# Patient Record
Sex: Female | Born: 1985
Health system: Southern US, Community
[De-identification: ages and names within clinical notes are randomized; demographics above are authoritative.]

## PROBLEM LIST (undated history)

## (undated) DIAGNOSIS — O99119 Other diseases of the blood and blood-forming organs and certain disorders involving the immune mechanism complicating pregnancy, unspecified trimester: Secondary | ICD-10-CM

## (undated) DIAGNOSIS — R87629 Unspecified abnormal cytological findings in specimens from vagina: Secondary | ICD-10-CM

## (undated) DIAGNOSIS — Z8619 Personal history of other infectious and parasitic diseases: Secondary | ICD-10-CM

## (undated) DIAGNOSIS — IMO0001 Reserved for inherently not codable concepts without codable children: Secondary | ICD-10-CM

## (undated) DIAGNOSIS — D6959 Other secondary thrombocytopenia: Secondary | ICD-10-CM

## (undated) DIAGNOSIS — D696 Thrombocytopenia, unspecified: Secondary | ICD-10-CM

## (undated) DIAGNOSIS — G44009 Cluster headache syndrome, unspecified, not intractable: Secondary | ICD-10-CM

## (undated) DIAGNOSIS — K219 Gastro-esophageal reflux disease without esophagitis: Secondary | ICD-10-CM

## (undated) DIAGNOSIS — F419 Anxiety disorder, unspecified: Secondary | ICD-10-CM

## (undated) HISTORY — DX: Other secondary thrombocytopenia: D69.59

## (undated) HISTORY — DX: Cluster headache syndrome, unspecified, not intractable: G44.009

## (undated) HISTORY — DX: Unspecified abnormal cytological findings in specimens from vagina: R87.629

## (undated) HISTORY — DX: Anxiety disorder, unspecified: F41.9

## (undated) HISTORY — DX: Personal history of other infectious and parasitic diseases: Z86.19

## (undated) HISTORY — PX: UPPER GASTROINTESTINAL ENDOSCOPY: SHX188

## (undated) HISTORY — DX: Gastro-esophageal reflux disease without esophagitis: K21.9

---
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
## 2007-02-08 DIAGNOSIS — D693 Immune thrombocytopenic purpura: Secondary | ICD-10-CM

## 2007-02-08 DIAGNOSIS — D6959 Other secondary thrombocytopenia: Secondary | ICD-10-CM

## 2007-02-08 HISTORY — DX: Immune thrombocytopenic purpura: D69.3

## 2007-02-08 HISTORY — DX: Other secondary thrombocytopenia: D69.59

## 2009-03-10 HISTORY — PX: MASTOPEXY: SUR857

## 2009-03-10 HISTORY — PX: OTHER SURGICAL HISTORY: SHX169

## 2011-11-12 ENCOUNTER — Ambulatory Visit (INDEPENDENT_AMBULATORY_CARE_PROVIDER_SITE_OTHER): Payer: 59 | Admitting: Internal Medicine

## 2011-11-12 ENCOUNTER — Encounter: Payer: Self-pay | Admitting: Internal Medicine

## 2011-11-12 ENCOUNTER — Other Ambulatory Visit (INDEPENDENT_AMBULATORY_CARE_PROVIDER_SITE_OTHER): Payer: 59

## 2011-11-12 VITALS — BP 126/72 | HR 72 | Temp 97.2°F | Ht 64.0 in | Wt 132.8 lb

## 2011-11-12 DIAGNOSIS — Z124 Encounter for screening for malignant neoplasm of cervix: Secondary | ICD-10-CM

## 2011-11-12 DIAGNOSIS — D239 Other benign neoplasm of skin, unspecified: Secondary | ICD-10-CM

## 2011-11-12 DIAGNOSIS — F411 Generalized anxiety disorder: Secondary | ICD-10-CM

## 2011-11-12 DIAGNOSIS — D229 Melanocytic nevi, unspecified: Secondary | ICD-10-CM

## 2011-11-12 DIAGNOSIS — F419 Anxiety disorder, unspecified: Secondary | ICD-10-CM | POA: Insufficient documentation

## 2011-11-12 DIAGNOSIS — Z0000 Encounter for general adult medical examination without abnormal findings: Secondary | ICD-10-CM

## 2011-11-12 LAB — LIPID PANEL
Cholesterol: 177 mg/dL (ref 0–200)
VLDL: 11 mg/dL (ref 0.0–40.0)

## 2011-11-12 LAB — URINALYSIS, ROUTINE W REFLEX MICROSCOPIC
Leukocytes, UA: NEGATIVE
Nitrite: NEGATIVE
Total Protein, Urine: NEGATIVE
pH: 6.5 (ref 5.0–8.0)

## 2011-11-12 LAB — HEPATIC FUNCTION PANEL
ALT: 16 U/L (ref 0–35)
AST: 21 U/L (ref 0–37)
Alkaline Phosphatase: 39 U/L (ref 39–117)
Bilirubin, Direct: 0.1 mg/dL (ref 0.0–0.3)
Total Protein: 7.1 g/dL (ref 6.0–8.3)

## 2011-11-12 LAB — TSH: TSH: 0.85 u[IU]/mL (ref 0.35–5.50)

## 2011-11-12 LAB — CBC WITH DIFFERENTIAL/PLATELET
Basophils Absolute: 0.1 10*3/uL (ref 0.0–0.1)
Eosinophils Relative: 1.3 % (ref 0.0–5.0)
Lymphocytes Relative: 37.6 % (ref 12.0–46.0)
Monocytes Relative: 5.9 % (ref 3.0–12.0)
Neutrophils Relative %: 54.1 % (ref 43.0–77.0)
Platelets: 161 10*3/uL (ref 150.0–400.0)
RDW: 13.6 % (ref 11.5–14.6)
WBC: 5.3 10*3/uL (ref 4.5–10.5)

## 2011-11-12 LAB — BASIC METABOLIC PANEL
BUN: 11 mg/dL (ref 6–23)
Calcium: 9.4 mg/dL (ref 8.4–10.5)
Creatinine, Ser: 0.6 mg/dL (ref 0.4–1.2)
GFR: 124.32 mL/min (ref >60.00)
Potassium: 4.6 mEq/L (ref 3.5–5.1)

## 2011-11-12 MED ORDER — ONDANSETRON 4 MG PO TBDP: 4.0000 mg | ORAL_TABLET | Freq: Three times a day (TID) | ORAL | Status: DC | PRN

## 2011-11-12 MED ORDER — NONFORMULARY OR COMPOUNDED ITEM: Status: DC

## 2011-11-12 MED ORDER — BUPROPION HCL ER (XL) 150 MG PO TB24: 150.0000 mg | ORAL_TABLET | Freq: Every day | ORAL | Status: DC

## 2011-11-12 NOTE — Progress Notes (Signed)
Subjective:    Patient ID: Lynn West, female    DOB: 02-04-1986, 26 y.o.   MRN: [id number]  HPI  New patient to me, here to establish care Also patient is here today for annual physical. Patient feels well overall.  Concerned about change in mole, hx dysplastic nevi - regular tanning bed use - ?local derm eval Also requests med refills  Past Medical History  Diagnosis Date  . History of chicken pox     childhood  . Headaches, cluster     stress related  . GERD (gastroesophageal reflux disease)   . 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 Anxiety    Family History  Problem Relation Age of Onset  . Arthritis Mother   . Hypertension Mother   . Arthritis Father   . Hyperlipidemia Father   . Hypertension Father   . Colon cancer Maternal Grandfather   . Breast cancer Paternal Grandmother   . Arthritis Other   . Hyperlipidemia Other   . Diabetes Other    History  Substance Use Topics  . Smoking status: Never Smoker   . Smokeless tobacco: Not on file  . Alcohol Use: Yes     social  moved to [place] 10/2011 to join [facility] [place] - previously from [place]  Review of Systems Constitutional: Negative for fever or weight change.  Respiratory: Negative for cough and shortness of breath.   Cardiovascular: Negative for chest pain or palpitations.  Gastrointestinal: Negative for abdominal pain, no bowel changes.  Musculoskeletal: Negative for gait problem or joint swelling.  Skin: Negative for rash.  Neurological: Negative for dizziness or headache.  Derm: change in skin mole? No other specific complaints in a complete review of systems (except as listed in HPI above).     Objective:   Physical Exam BP 126/72  Pulse 72  Temp 97.2 F (36.2 C) (Oral)  Ht 5\' 4"  (1.626 m)  Wt 132 lb 12.8 oz (60.238 kg)  BMI 22.80 kg/m2  SpO2 97% Wt Readings from Last 3 Encounters:  11/12/11 132 lb 12.8 oz (60.238 kg)   Constitutional: She appears well-developed and  well-nourished. No distress.  HENT: Head: Normocephalic and atraumatic. Ears: B TMs ok, no erythema or effusion; Nose: Nose normal. Mouth/Throat: Oropharynx is clear and moist. No oropharyngeal exudate.  Eyes: Conjunctivae and EOM are normal. Pupils are equal, round, and reactive to light. No scleral icterus.  Neck: Normal range of motion. Neck supple. No JVD present. No thyromegaly present.  Cardiovascular: Normal rate, regular rhythm and normal heart sounds.  No murmur heard. No BLE edema. Pulmonary/Chest: Effort normal and breath sounds normal. No respiratory distress. She has no wheezes.  Abdominal: Soft. Bowel sounds are normal. She exhibits no distension. There is no tenderness. no masses Musculoskeletal: Normal range of motion, no joint effusions. No gross deformities Neurological: She is alert and oriented to person, place, and time. No cranial nerve deficit. Coordination normal.  Skin: Skin is warm and dry. No rash noted. No erythema. numerous typical appearing moles - BUE and trunk Psychiatric: She has a normal mood and affect. Her behavior is normal. Judgment and thought content normal.   No results found for this basename: WBC, HGB, HCT, PLT, GLUCOSE, CHOL, TRIG, HDL, LDLDIRECT, LDLCALC, ALT, AST, NA, K, CL, CREATININE, BUN, CO2, TSH, PSA, INR, GLUF, HGBA1C, MICROALBUR       Assessment & Plan:   CPX/v70.0 - Patient has been counseled on age-appropriate routine health concerns for screening  and prevention. These are reviewed and up-to-date. Immunizations are up-to-date or declined. Labs and  Reviewed. Refer to gyn  Change in mole - regular tanning bed use - refer to derm for eval/tx  Also see problem list. Medications and labs reviewed today.

## 2011-11-12 NOTE — Assessment & Plan Note (Signed)
On wellbutrin for mgmt of dyspnea symptoms since 2010 The current medical regimen is effective;  continue present plan and medications.

## 2011-11-12 NOTE — Patient Instructions (Signed)
It was good to see you today. We have reviewed your prior records including labs and tests today Health Maintenance reviewed - all recommended immunizations and age-appropriate screenings are up-to-date. Medications reviewed, no changes at this time. Refill on medication(s) as discussed today. Test(s) ordered today. Your results will be called to you after review (48-72hours after test completion). If any changes need to be made, you will be notified at that time. we'll make referral to gynecology and dermatology . Our office will contact you regarding appointment(s) once made. Please schedule followup in 1 year for review, call sooner if problems.

## 2011-11-13 ENCOUNTER — Telehealth: Payer: Self-pay | Admitting: *Deleted

## 2011-11-13 DIAGNOSIS — Z0000 Encounter for general adult medical examination without abnormal findings: Secondary | ICD-10-CM

## 2011-11-13 NOTE — Telephone Encounter (Signed)
Received staff msg pt made cpx for 11/2012 putting labs in epic...Raechel Chute

## 2011-11-13 NOTE — Telephone Encounter (Signed)
Message copied by Deatra James on Thu Nov 13, 2011 12:15 PM ------      Message from: Etheleen Sia      Created: Wed Nov 12, 2011  4:33 PM      Regarding: LABS       PHYSICAL LABS FOR SEPT 2014

## 2012-06-03 ENCOUNTER — Other Ambulatory Visit: Payer: Self-pay | Admitting: *Deleted

## 2012-06-03 MED ORDER — RANITIDINE HCL 150 MG PO TABS: 150.0000 mg | ORAL_TABLET | Freq: Two times a day (BID) | ORAL | Status: DC

## 2012-07-02 ENCOUNTER — Other Ambulatory Visit: Payer: Self-pay | Admitting: Internal Medicine

## 2012-07-02 ENCOUNTER — Telehealth: Payer: Self-pay | Admitting: *Deleted

## 2012-07-02 DIAGNOSIS — G4725 Circadian rhythm sleep disorder, jet lag type: Secondary | ICD-10-CM

## 2012-07-02 MED ORDER — TEMAZEPAM 15 MG PO CAPS: 15.0000 mg | ORAL_CAPSULE | Freq: Every evening | ORAL | Status: DC | PRN

## 2012-07-02 NOTE — Telephone Encounter (Signed)
Left msg on triage stating she is going on vacation to [place]. Wanting to see if md would rx restoril to help her sleep while she is on the plane.MD is out of office. Pls advise...lmb

## 2012-07-02 NOTE — Telephone Encounter (Signed)
Called pt no answer LMOM rx fax to [place]...lmb

## 2012-07-02 NOTE — Telephone Encounter (Signed)
RX called into pharmacy

## 2012-09-16 ENCOUNTER — Telehealth: Payer: Self-pay | Admitting: Internal Medicine

## 2012-09-16 MED ORDER — FLUOCINOLONE ACETONIDE 0.01 % OT OIL: 5.0000 [drp] | TOPICAL_OIL | Freq: Two times a day (BID) | OTIC | Status: DC

## 2012-09-16 NOTE — Telephone Encounter (Signed)
Seen informally on a walk in basis for itching in the left ear w/o pain, drainage or hearing change. There was mild erythema but no abrasion or sign of fungal infection. TM was normal.   Plan - flucocinolone otic 5 qtts bid x 7 days.

## 2012-11-09 ENCOUNTER — Other Ambulatory Visit: Payer: Self-pay | Admitting: Internal Medicine

## 2012-11-12 ENCOUNTER — Encounter: Payer: Self-pay | Admitting: Internal Medicine

## 2012-11-12 ENCOUNTER — Other Ambulatory Visit (INDEPENDENT_AMBULATORY_CARE_PROVIDER_SITE_OTHER): Payer: 59

## 2012-11-12 ENCOUNTER — Ambulatory Visit (INDEPENDENT_AMBULATORY_CARE_PROVIDER_SITE_OTHER): Payer: 59 | Admitting: Internal Medicine

## 2012-11-12 VITALS — BP 122/76 | HR 66 | Temp 97.8°F | Ht 64.0 in | Wt 136.8 lb

## 2012-11-12 DIAGNOSIS — F419 Anxiety disorder, unspecified: Secondary | ICD-10-CM

## 2012-11-12 DIAGNOSIS — Z0000 Encounter for general adult medical examination without abnormal findings: Secondary | ICD-10-CM

## 2012-11-12 DIAGNOSIS — F411 Generalized anxiety disorder: Secondary | ICD-10-CM

## 2012-11-12 LAB — CBC WITH DIFFERENTIAL/PLATELET
Basophils Absolute: 0 10*3/uL (ref 0.0–0.1)
Basophils Relative: 0.4 % (ref 0.0–3.0)
Eosinophils Absolute: 0.2 10*3/uL (ref 0.0–0.7)
Lymphocytes Relative: 34.1 % (ref 12.0–46.0)
MCHC: 34.1 g/dL (ref 30.0–36.0)
Monocytes Relative: 6.3 % (ref 3.0–12.0)
Neutrophils Relative %: 54.9 % (ref 43.0–77.0)
RBC: 4.42 Mil/uL (ref 3.87–5.11)
WBC: 5.1 10*3/uL (ref 4.5–10.5)

## 2012-11-12 LAB — URINALYSIS, ROUTINE W REFLEX MICROSCOPIC
Bilirubin Urine: NEGATIVE
Ketones, ur: NEGATIVE
Leukocytes, UA: NEGATIVE
Urobilinogen, UA: 0.2 (ref 0.0–1.0)

## 2012-11-12 LAB — LIPID PANEL
HDL: 69.2 mg/dL (ref >39.00)
LDL Cholesterol: 100 mg/dL — ABNORMAL HIGH (ref 0–99)
Total CHOL/HDL Ratio: 3
Triglycerides: 44 mg/dL (ref 0.0–149.0)
VLDL: 8.8 mg/dL (ref 0.0–40.0)

## 2012-11-12 LAB — HEPATIC FUNCTION PANEL
ALT: 15 U/L (ref 0–35)
AST: 20 U/L (ref 0–37)
Bilirubin, Direct: 0 mg/dL (ref 0.0–0.3)
Total Bilirubin: 0.5 mg/dL (ref 0.3–1.2)

## 2012-11-12 LAB — BASIC METABOLIC PANEL
CO2: 25 mEq/L (ref 19–32)
Calcium: 9.4 mg/dL (ref 8.4–10.5)
Creatinine, Ser: 0.7 mg/dL (ref 0.4–1.2)
GFR: 112.71 mL/min (ref >60.00)

## 2012-11-12 NOTE — Progress Notes (Signed)
Subjective:    Patient ID: Lynn West, female    DOB: 10-09-1985, 27 y.o.   MRN: [id number]  HPI  patient is here today for annual physical. Patient feels well overall.   Past Medical History  Diagnosis Date  . History of chicken pox     childhood  . Headaches, cluster     stress related  . GERD (gastroesophageal reflux disease)   . 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 Anxiety    Family History  Problem Relation Age of Onset  . Arthritis Mother   . Hypertension Mother   . Arthritis Father   . Hyperlipidemia Father   . Hypertension Father   . Colon cancer Maternal Grandfather   . Breast cancer Paternal Grandmother   . Arthritis Other   . Hyperlipidemia Other   . Diabetes Other    History  Substance Use Topics  . Smoking status: Never Smoker   . Smokeless tobacco: Not on file     Comment: [place] with [facility], moved to [place] 10/2011 from [place]  with spouse  . Alcohol Use: Yes     Comment: social  moved to [place] 10/2011 to join [facility] [place] - previously [place]  Review of Systems Constitutional: Negative for fever or weight change.  Respiratory: Negative for cough and shortness of breath.   Cardiovascular: Negative for chest pain or palpitations.  Gastrointestinal: Negative for abdominal pain, no bowel changes.  Musculoskeletal: Negative for gait problem or joint swelling.  Skin: Negative for rash.  Neurological: Negative for dizziness or headache.  No other specific complaints in a complete review of systems (except as listed in HPI above).     Objective:   Physical Exam BP 122/76  Pulse 66  Temp(Src) 97.8 F (36.6 C) (Oral)  Ht 5\' 4"  (1.626 m)  Wt 136 lb 12.8 oz (62.052 kg)  BMI 23.47 kg/m2  SpO2 98% Wt Readings from Last 3 Encounters:  11/12/12 136 lb 12.8 oz (62.052 kg)  11/12/11 132 lb 12.8 oz (60.238 kg)   Constitutional: She appears well-developed and well-nourished. No distress.  HENT: Head: Normocephalic and atraumatic.  Ears: B TMs ok, no erythema or effusion; Nose: Nose normal. Mouth/Throat: Oropharynx is clear and moist. No oropharyngeal exudate.  Eyes: Conjunctivae and EOM are normal. Pupils are equal, round, and reactive to light. No scleral icterus.  Neck: Normal range of motion. Neck supple. No JVD present. No thyromegaly present.  Cardiovascular: Normal rate, regular rhythm and normal heart sounds.  No murmur heard. No BLE edema. Pulmonary/Chest: Effort normal and breath sounds normal. No respiratory distress. She has no wheezes.  Abdominal: Soft. Bowel sounds are normal. She exhibits no distension. There is no tenderness. no masses Musculoskeletal: Normal range of motion, no joint effusions. No gross deformities Neurological: She is alert and oriented to person, place, and time. No cranial nerve deficit. Coordination normal.  Skin: Skin is warm and dry. No rash noted. No erythema. numerous typical appearing moles - BUE and trunk Psychiatric: She has a mildly anxious mood and affect. Her behavior is normal. Judgment and thought content normal.   Lab Results  Component Value Date   WBC 5.3 11/12/2011   HGB 12.6 11/12/2011   HCT 37.9 11/12/2011   PLT 161.0 11/12/2011   GLUCOSE 86 11/12/2011   CHOL 177 11/12/2011   TRIG 55.0 11/12/2011   HDL 71.50 11/12/2011   LDLCALC 95 11/12/2011   ALT 16 11/12/2011   AST 21 11/12/2011  NA 134* 11/12/2011   K 4.6 11/12/2011   CL 103 11/12/2011   CREATININE 0.6 11/12/2011   BUN 11 11/12/2011   CO2 24 11/12/2011   TSH 0.85 11/12/2011       Assessment & Plan:   CPX/v70.0 - Patient has been counseled on age-appropriate routine health concerns for screening and prevention. These are reviewed and up-to-date. Immunizations are up-to-date or declined. Labs ordered and reviewed.   Also see problem list. Medications and labs reviewed today.

## 2012-11-12 NOTE — Patient Instructions (Addendum)
It was good to see you today. We have reviewed your prior records including labs and tests today Health Maintenance reviewed - all recommended immunizations and age-appropriate screenings are up-to-date. Test(s) ordered today. Your results will be released to MyChart (or called to you) after review, usually within 72hours after test completion. If any changes need to be made, you will be notified at that same time. Medications reviewed and updated, no changes recommended at this time. Contact EAP - let me know if i can help with other counseling if needed Please schedule followup in 12 months for annual exam and labs, call sooner if problems.    Health Maintenance, Females A healthy lifestyle and preventative care can promote health and wellness.  Maintain regular health, dental, and eye exams.  Eat a healthy diet. Foods like vegetables, fruits, whole grains, low-fat dairy products, and lean protein foods contain the nutrients you need without too many calories. Decrease your intake of foods high in solid fats, added sugars, and salt. Get information about a proper diet from your caregiver, if necessary.  Regular physical exercise is one of the most important things you can do for your health. Most adults should get at least 150 minutes of moderate-intensity exercise (any activity that increases your heart rate and causes you to sweat) each week. In addition, most adults need muscle-strengthening exercises on 2 or more days a week.   Maintain a healthy weight. The body mass index (BMI) is a screening tool to identify possible weight problems. It provides an estimate of body fat based on height and weight. Your caregiver can help determine your BMI, and can help you achieve or maintain a healthy weight. For adults 20 years and older:  A BMI below 18.5 is considered underweight.  A BMI of 18.5 to 24.9 is normal.  A BMI of 25 to 29.9 is considered overweight.  A BMI of 30 and above is  considered obese.  Maintain normal blood lipids and cholesterol by exercising and minimizing your intake of saturated fat. Eat a balanced diet with plenty of fruits and vegetables. Blood tests for lipids and cholesterol should begin at age 48 and be repeated every 5 years. If your lipid or cholesterol levels are high, you are over 50, or you are a high risk for heart disease, you may need your cholesterol levels checked more frequently.Ongoing high lipid and cholesterol levels should be treated with medicines if diet and exercise are not effective.  If you smoke, find out from your caregiver how to quit. If you do not use tobacco, do not start.  If you are pregnant, do not drink alcohol. If you are breastfeeding, be very cautious about drinking alcohol. If you are not pregnant and choose to drink alcohol, do not exceed 1 drink per day. One drink is considered to be 12 ounces (355 mL) of beer, 5 ounces (148 mL) of wine, or 1.5 ounces (44 mL) of liquor.  Avoid use of street drugs. Do not share needles with anyone. Ask for help if you need support or instructions about stopping the use of drugs.  High blood pressure causes heart disease and increases the risk of stroke. Blood pressure should be checked at least every 1 to 2 years. Ongoing high blood pressure should be treated with medicines, if weight loss and exercise are not effective.  If you are 64 to 29 years old, ask your caregiver if you should take aspirin to prevent strokes.  Diabetes screening involves taking a blood  sample to check your fasting blood sugar level. This should be done once every 3 years, after age 55, if you are within normal weight and without risk factors for diabetes. Testing should be considered at a younger age or be carried out more frequently if you are overweight and have at least 1 risk factor for diabetes.  Breast cancer screening is essential preventative care for women. You should practice "breast self-awareness."  This means understanding the normal appearance and feel of your breasts and may include breast self-examination. Any changes detected, no matter how small, should be reported to a caregiver. Women in their 83s and 30s should have a clinical breast exam (CBE) by a caregiver as part of a regular health exam every 1 to 3 years. After age 37, women should have a CBE every year. Starting at age 30, women should consider having a mammogram (breast X-ray) every year. Women who have a family history of breast cancer should talk to their caregiver about genetic screening. Women at a high risk of breast cancer should talk to their caregiver about having an MRI and a mammogram every year.  The Pap test is a screening test for cervical cancer. Women should have a Pap test starting at age 3. Between ages 30 and 61, Pap tests should be repeated every 2 years. Beginning at age 44, you should have a Pap test every 3 years as long as the past 3 Pap tests have been normal. If you had a hysterectomy for a problem that was not cancer or a condition that could lead to cancer, then you no longer need Pap tests. If you are between ages 79 and 21, and you have had normal Pap tests going back 10 years, you no longer need Pap tests. If you have had past treatment for cervical cancer or a condition that could lead to cancer, you need Pap tests and screening for cancer for at least 20 years after your treatment. If Pap tests have been discontinued, risk factors (such as a new sexual partner) need to be reassessed to determine if screening should be resumed. Some women have medical problems that increase the chance of getting cervical cancer. In these cases, your caregiver may recommend more frequent screening and Pap tests.  The human papillomavirus (HPV) test is an additional test that may be used for cervical cancer screening. The HPV test looks for the virus that can cause the cell changes on the cervix. The cells collected during the  Pap test can be tested for HPV. The HPV test could be used to screen women aged 98 years and older, and should be used in women of any age who have unclear Pap test results. After the age of 74, women should have HPV testing at the same frequency as a Pap test.  Colorectal cancer can be detected and often prevented. Most routine colorectal cancer screening begins at the age of 55 and continues through age 47. However, your caregiver may recommend screening at an earlier age if you have risk factors for colon cancer. On a yearly basis, your caregiver may provide home test kits to check for hidden blood in the stool. Use of a small camera at the end of a tube, to directly examine the colon (sigmoidoscopy or colonoscopy), can detect the earliest forms of colorectal cancer. Talk to your caregiver about this at age 62, when routine screening begins. Direct examination of the colon should be repeated every 5 to 10 years through age 12,  unless early forms of pre-cancerous polyps or small growths are found.  Hepatitis C blood testing is recommended for all people born from [birth year] through 1967 and any individual with known risks for hepatitis C.  Practice safe sex. Use condoms and avoid high-risk sexual practices to reduce the spread of sexually transmitted infections (STIs). Sexually active women aged 9 and younger should be checked for Chlamydia, which is a common sexually transmitted infection. Older women with new or multiple partners should also be tested for Chlamydia. Testing for other STIs is recommended if you are sexually active and at increased risk.  Osteoporosis is a disease in which the bones lose minerals and strength with aging. This can result in serious bone fractures. The risk of osteoporosis can be identified using a bone density scan. Women ages 60 and over and women at risk for fractures or osteoporosis should discuss screening with their caregivers. Ask your caregiver whether you should be  taking a calcium supplement or vitamin D to reduce the rate of osteoporosis.  Menopause can be associated with physical symptoms and risks. Hormone replacement therapy is available to decrease symptoms and risks. You should talk to your caregiver about whether hormone replacement therapy is right for you.  Use sunscreen with a sun protection factor (SPF) of 30 or greater. Apply sunscreen liberally and repeatedly throughout the day. You should seek shade when your shadow is shorter than you. Protect yourself by wearing long sleeves, pants, a wide-brimmed hat, and sunglasses year round, whenever you are outdoors.  Notify your caregiver of new moles or changes in moles, especially if there is a change in shape or color. Also notify your caregiver if a mole is larger than the size of a pencil eraser.  Stay current with your immunizations. Document Released: 09/09/2010 Document Revised: 05/19/2011 Document Reviewed: 09/09/2010 [facility] Patient Information 2014 [place], [place].

## 2012-11-12 NOTE — Assessment & Plan Note (Signed)
On wellbutrin for mgmt of dyspnea symptoms since 2010 Mild increase in symptoms - not requiring medication change for symptoms mgmt at this time Advised counseling - pt will try EAP 1st and contact me if additional aast needed - or if symptoms worse Verified no si/hi

## 2012-11-16 ENCOUNTER — Encounter: Payer: 59 | Admitting: Internal Medicine

## 2012-11-23 ENCOUNTER — Encounter: Payer: 59 | Admitting: Internal Medicine

## 2012-12-08 ENCOUNTER — Other Ambulatory Visit: Payer: Self-pay | Admitting: Internal Medicine

## 2013-01-03 ENCOUNTER — Other Ambulatory Visit: Payer: Self-pay | Admitting: Internal Medicine

## 2013-04-18 ENCOUNTER — Encounter (HOSPITAL_COMMUNITY): Payer: Self-pay | Admitting: Pharmacist

## 2013-04-29 ENCOUNTER — Ambulatory Visit (HOSPITAL_COMMUNITY)
Admission: RE | Admit: 2013-04-29 | Discharge: 2013-04-29 | Disposition: A | Discharge status: Home / Self Care | Payer: 59 | Source: Ambulatory Visit | Attending: Obstetrics and Gynecology | Admitting: Obstetrics and Gynecology

## 2013-04-29 ENCOUNTER — Encounter (HOSPITAL_BASED_OUTPATIENT_CLINIC_OR_DEPARTMENT_OTHER): Admission: RE | Payer: Self-pay | Source: Ambulatory Visit

## 2013-04-29 ENCOUNTER — Encounter (HOSPITAL_COMMUNITY): Payer: Self-pay | Admitting: *Deleted

## 2013-04-29 ENCOUNTER — Ambulatory Visit (HOSPITAL_BASED_OUTPATIENT_CLINIC_OR_DEPARTMENT_OTHER): Admission: RE | Admit: 2013-04-29 | Payer: 59 | Source: Ambulatory Visit | Admitting: Obstetrics and Gynecology

## 2013-04-29 ENCOUNTER — Encounter (HOSPITAL_COMMUNITY): Payer: 59 | Admitting: Anesthesiology

## 2013-04-29 ENCOUNTER — Encounter (HOSPITAL_COMMUNITY)
Admission: RE | Disposition: A | Discharge status: Home / Self Care | Payer: Self-pay | Source: Ambulatory Visit | Attending: Obstetrics and Gynecology

## 2013-04-29 ENCOUNTER — Ambulatory Visit (HOSPITAL_COMMUNITY): Payer: 59 | Admitting: Anesthesiology

## 2013-04-29 DIAGNOSIS — K219 Gastro-esophageal reflux disease without esophagitis: Secondary | ICD-10-CM | POA: Insufficient documentation

## 2013-04-29 DIAGNOSIS — F411 Generalized anxiety disorder: Secondary | ICD-10-CM | POA: Insufficient documentation

## 2013-04-29 DIAGNOSIS — Q5128 Other doubling of uterus, other specified: Secondary | ICD-10-CM | POA: Insufficient documentation

## 2013-04-29 DIAGNOSIS — Q512 Other doubling of uterus, unspecified: Principal | ICD-10-CM

## 2013-04-29 HISTORY — PX: HYSTEROSCOPY WITH D & C: SHX1775

## 2013-04-29 LAB — CBC
HEMATOCRIT: 37.9 % (ref 36.0–46.0)
Hemoglobin: 12.8 g/dL (ref 12.0–15.0)
MCH: 29.5 pg (ref 26.0–34.0)
MCHC: 33.8 g/dL (ref 30.0–36.0)
MCV: 87.3 fL (ref 78.0–100.0)
Platelets: 162 10*3/uL (ref 150–400)
RBC: 4.34 MIL/uL (ref 3.87–5.11)
RDW: 13.7 % (ref 11.5–15.5)
WBC: 6.8 10*3/uL (ref 4.0–10.5)

## 2013-04-29 SURGERY — DILATATION AND CURETTAGE /HYSTEROSCOPY
Anesthesia: General | Site: Vagina

## 2013-04-29 SURGERY — HYSTEROSCOPY
Anesthesia: General

## 2013-04-29 MED ORDER — FENTANYL CITRATE 0.05 MG/ML IJ SOLN
INTRAMUSCULAR | Status: DC | PRN
  Administered 2013-04-29: 50 ug via INTRAVENOUS
  Administered 2013-04-29: 100 ug via INTRAVENOUS

## 2013-04-29 MED ORDER — LACTATED RINGERS IV SOLN
INTRAVENOUS | Status: DC
  Administered 2013-04-29 (×2): via INTRAVENOUS

## 2013-04-29 MED ORDER — DEXAMETHASONE SODIUM PHOSPHATE 4 MG/ML IJ SOLN
INTRAMUSCULAR | Status: DC | PRN
  Administered 2013-04-29: 10 mg via INTRAVENOUS

## 2013-04-29 MED ORDER — OXYCODONE HCL 5 MG/5ML PO SOLN: 5.0000 mg | Freq: Once | ORAL | Status: DC | PRN

## 2013-04-29 MED ORDER — GLYCINE 1.5 % IR SOLN
Status: DC | PRN
  Administered 2013-04-29: 3000 mL

## 2013-04-29 MED ORDER — PROPOFOL INFUSION 10 MG/ML OPTIME
INTRAVENOUS | Status: DC | PRN
  Administered 2013-04-29: 200 mL via INTRAVENOUS

## 2013-04-29 MED ORDER — CEFAZOLIN SODIUM-DEXTROSE 2-3 GM-% IV SOLR
2.0000 g | INTRAVENOUS | Status: AC
  Administered 2013-04-29: 2 g via INTRAVENOUS

## 2013-04-29 MED ORDER — SODIUM CHLORIDE 0.9 % IJ SOLN
INTRAMUSCULAR | Status: AC
  Filled 2013-04-29: qty 50

## 2013-04-29 MED ORDER — FENTANYL CITRATE 0.05 MG/ML IJ SOLN
INTRAMUSCULAR | Status: AC
  Filled 2013-04-29: qty 2

## 2013-04-29 MED ORDER — KETOROLAC TROMETHAMINE 30 MG/ML IJ SOLN
INTRAMUSCULAR | Status: DC | PRN
  Administered 2013-04-29: 30 mg via INTRAVENOUS

## 2013-04-29 MED ORDER — MIDAZOLAM HCL 2 MG/2ML IJ SOLN
INTRAMUSCULAR | Status: AC
  Filled 2013-04-29: qty 2

## 2013-04-29 MED ORDER — ONDANSETRON HCL 4 MG/2ML IJ SOLN
INTRAMUSCULAR | Status: DC | PRN
  Administered 2013-04-29: 4 mg via INTRAVENOUS

## 2013-04-29 MED ORDER — FENTANYL CITRATE 0.05 MG/ML IJ SOLN
INTRAMUSCULAR | Status: AC
Start: 2013-04-29 — End: 2013-04-29
  Filled 2013-04-29: qty 2

## 2013-04-29 MED ORDER — LIDOCAINE HCL (CARDIAC) 20 MG/ML IV SOLN
INTRAVENOUS | Status: DC | PRN
  Administered 2013-04-29: 80 mg via INTRAVENOUS

## 2013-04-29 MED ORDER — VASOPRESSIN 20 UNIT/ML IJ SOLN
INTRAMUSCULAR | Status: AC
  Filled 2013-04-29: qty 1

## 2013-04-29 MED ORDER — LIDOCAINE HCL (CARDIAC) 20 MG/ML IV SOLN
INTRAVENOUS | Status: AC
  Filled 2013-04-29: qty 5

## 2013-04-29 MED ORDER — FENTANYL CITRATE 0.05 MG/ML IJ SOLN: 25.0000 ug | INTRAMUSCULAR | Status: DC | PRN

## 2013-04-29 MED ORDER — SODIUM CHLORIDE 0.9 % IV SOLN
INTRAVENOUS | Status: DC | PRN
  Administered 2013-04-29: 15:00:00 via INTRAMUSCULAR

## 2013-04-29 MED ORDER — SCOPOLAMINE 1 MG/3DAYS TD PT72
MEDICATED_PATCH | TRANSDERMAL | Status: AC
  Filled 2013-04-29: qty 1

## 2013-04-29 MED ORDER — PROPOFOL 10 MG/ML IV EMUL
INTRAVENOUS | Status: AC
  Filled 2013-04-29: qty 20

## 2013-04-29 MED ORDER — ONDANSETRON HCL 4 MG/2ML IJ SOLN
INTRAMUSCULAR | Status: AC
  Filled 2013-04-29: qty 2

## 2013-04-29 MED ORDER — KETOROLAC TROMETHAMINE 30 MG/ML IJ SOLN: 15.0000 mg | Freq: Once | INTRAMUSCULAR | Status: DC | PRN

## 2013-04-29 MED ORDER — ONDANSETRON HCL 4 MG/2ML IJ SOLN: 4.0000 mg | Freq: Once | INTRAMUSCULAR | Status: DC | PRN

## 2013-04-29 MED ORDER — MIDAZOLAM HCL 2 MG/2ML IJ SOLN
INTRAMUSCULAR | Status: DC | PRN
  Administered 2013-04-29: 2 mg via INTRAVENOUS

## 2013-04-29 MED ORDER — KETOROLAC TROMETHAMINE 60 MG/2ML IM SOLN
INTRAMUSCULAR | Status: DC | PRN
  Administered 2013-04-29: 30 mg via INTRAMUSCULAR

## 2013-04-29 MED ORDER — OXYCODONE HCL 5 MG PO TABS: 5.0000 mg | ORAL_TABLET | Freq: Once | ORAL | Status: DC | PRN

## 2013-04-29 MED ORDER — ACETAMINOPHEN 325 MG PO TABS: 325.0000 mg | ORAL_TABLET | ORAL | Status: DC | PRN

## 2013-04-29 MED ORDER — ACETAMINOPHEN 160 MG/5ML PO SOLN: 325.0000 mg | ORAL | Status: DC | PRN

## 2013-04-29 SURGICAL SUPPLY — 31 items
CANISTER SUCT 3000ML (MISCELLANEOUS) ×2 IMPLANT
CANNULA CURETTE W/SYR 6 (CANNULA) IMPLANT
CANNULA CURETTE W/SYR 7 (CANNULA) IMPLANT
CATH NOVY CORNUAL CURVED 5.0 (CATHETERS) IMPLANT
CATH ROBINSON RED A/P 16FR (CATHETERS) IMPLANT
CLOTH BEACON ORANGE TIMEOUT ST (SAFETY) ×2 IMPLANT
CONTAINER PREFILL 10% NBF 60ML (FORM) ×4 IMPLANT
DRAPE C-ARM 42X72 X-RAY (DRAPES) IMPLANT
DRAPE HYSTEROSCOPY (DRAPE) ×2 IMPLANT
DRSG TELFA 3X8 NADH (GAUZE/BANDAGES/DRESSINGS) ×2 IMPLANT
ELECT REM PT RETURN 9FT ADLT (ELECTROSURGICAL) ×2
ELECTRODE REM PT RTRN 9FT ADLT (ELECTROSURGICAL) ×1 IMPLANT
ELECTRODE ROLLER VERSAPOINT (ELECTRODE) IMPLANT
ELECTRODE RT ANGLE VERSAPOINT (CUTTING LOOP) IMPLANT
GLOVE BIO SURGEON STRL SZ8 (GLOVE) ×2 IMPLANT
GLOVE BIOGEL PI IND STRL 8.5 (GLOVE) ×1 IMPLANT
GLOVE BIOGEL PI INDICATOR 8.5 (GLOVE) ×1
GOWN STRL REUS W/TWL LRG LVL3 (GOWN DISPOSABLE) ×4 IMPLANT
LOOP ANGLED CUTTING 22FR (CUTTING LOOP) IMPLANT
NEEDLE SPNL 22GX3.5 QUINCKE BK (NEEDLE) ×2 IMPLANT
PACK VAGINAL MINOR WOMEN LF (CUSTOM PROCEDURE TRAY) ×2 IMPLANT
PAD OB MATERNITY 4.3X12.25 (PERSONAL CARE ITEMS) ×2 IMPLANT
SEPRAFILM MEMBRANE 5X6 (MISCELLANEOUS) ×2 IMPLANT
SET TUBING HYSTEROSCOPY 2 NDL (TUBING) ×2 IMPLANT
STENT BALLN UTERINE 3CM 6FR (Stent) IMPLANT
STENT BALLN UTERINE 4CM 6FR (STENTS) IMPLANT
SUT SILK 2 0 SH (SUTURE) IMPLANT
SYR CONTROL 10ML LL (SYRINGE) ×2 IMPLANT
TOWEL OR 17X24 6PK STRL BLUE (TOWEL DISPOSABLE) ×4 IMPLANT
TUBE HYSTEROSCOPY W Y-CONNECT (TUBING) ×2 IMPLANT
WATER STERILE IRR 1000ML POUR (IV SOLUTION) ×2 IMPLANT

## 2013-04-29 NOTE — Discharge Instructions (Signed)

## 2013-04-29 NOTE — Progress Notes (Signed)
intervHistory and Physical Interval Note:  04/29/2013 2:20 PM  Lynn West  has presented today for surgery, with the diagnosis of UTERINE SEPTUM  The various methods of treatment have been discussed with the patient and family. After consideration of risks, benefits and other options for treatment, the patient has consented to  Procedure(s): DILATATION AND CURETTAGE /HYSTEROSCOPY WITH EXCISION OF SEPTUM (N/A) as a surgical intervention .  The patient's history has been reviewed, patient examined, no change in status, stable for surgery.  I have reviewed the patient's chart and labs.  Questions were answered to the patient's satisfaction.     Governor Specking

## 2013-04-29 NOTE — Anesthesia Postprocedure Evaluation (Signed)
  Anesthesia Post-op Note  Patient: Lynn West  Procedure(s) Performed: Procedure(s): DILATATION AND CURETTAGE /HYSTEROSCOPY WITH EXCISION OF SEPTUM (N/A)  Patient Location: PACU  Anesthesia Type:General  Level of Consciousness: awake, alert  and oriented  Airway and Oxygen Therapy: Patient Spontanous Breathing  Post-op Pain: mild  Post-op Assessment: Post-op Vital signs reviewed, Patient's Cardiovascular Status Stable, Respiratory Function Stable, Patent Airway, No signs of Nausea or vomiting and Pain level controlled  Post-op Vital Signs: Reviewed and stable  Complications: No apparent anesthesia complications

## 2013-04-29 NOTE — Transfer of Care (Signed)
Immediate Anesthesia Transfer of Care Note  Patient: Lynn West  Procedure(s) Performed: Procedure(s): DILATATION AND CURETTAGE /HYSTEROSCOPY WITH EXCISION OF SEPTUM (N/A)  Patient Location: PACU  Anesthesia Type:General  Level of Consciousness: awake  Airway & Oxygen Therapy: Patient Spontanous Breathing  Post-op Assessment: Report given to PACU RN  Post vital signs: stable  Filed Vitals:   04/29/13 1215  BP: 138/85  Pulse:   Temp:   Resp:     Complications: No apparent anesthesia complications

## 2013-04-29 NOTE — Op Note (Addendum)
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 Surgeon: Governor Specking  Specimens: None Estimated blood loss: Less than 20 mL Complications: None  Findings: Unexamined anesthesia the vagina was normal ostia on the cervix. The uterus was normal size anteverted with a wide and smooth fundus. There were no adnexal masses.  On hysteroscopy, both endocervical canals were normal there was a 1 cm gap in the complete uterine septum. Above this communication rest of the thin septum continued up to the fundus and was about 2.7 cm long. Uterus sounded to 7 1/2cm. Both tubal ostia were seen. Endometrium was thin and, consistent with the patient's taking low dose combination oral contraceptives.   Description of the procedure: Patient was placed in dorsal supine position under general anesthesia and 2 g of cefazolin were given intravenously for prophylaxis. She was then placed in lithotomy position and prepped and draped inside manner. A dilute vasopressin solution (0.4 units per mL) was injected into the cervix. Above findings were noted. A Slimline hysteroscope with 12 lens was inserted into each cervical canal and video hysteroscopy was started. Distention medium what saline. Distention method was gravity. An 8 [place] pediatric Foley catheter was inserted into the right cervical canal and its balloon was inflated with 1.5 mL of saline. The hysteroscope was then guided through the left cervical canal and above findings were noted. Using hysteroscopic scissors the thin and and uterine septum was easily incise up to an imaginary line joining the 2 tubal ostia. The intracervical septum was kept intact. Hemostasis was insured. As an adhesion barrier, a sheet of Seprafilm was rolled around a DeBakey forceps and inserted up  to the fundus. The patient tolerated the procedure well and was transferred to recovery room in satisfactory condition. She will take Estrace 4 mg twice a day for the next 30 days, for the last 5 days of which Provera 10 mg daily will be added.  Governor Specking

## 2013-04-29 NOTE — H&P (Signed)
Lynn West is a 28 y.o. 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 She had an MRI , followed by a saline contrast sonohysterogram by me, which shows a to 3 mm communication between the 2 uterine horns just above the internal cervical ostia. A longitudinal vaginal septum has been excised previously. Patient wishes to conceive in the near future.  Pertinent Gynecological History: Menses: flow is normal Contraception: none DES exposure: denies Blood transfusions: none Sexually transmitted diseases: no past history Previous GYN Procedures: Excision of longitudinal vaginal septum Last mammogram: normal Last pap: normal    Menstrual History: Menarche age: 26 No LMP recorded.    Past Medical History  Diagnosis Date  . History of chicken pox     childhood  . Headaches, cluster     stress related  . GERD (gastroesophageal reflux disease)   . 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 Anxiety                     Past Surgical History  Procedure Laterality Date  . Wisdom tooth extraction  2003  . Vaginal septoplasty  2004  . Mastopexy  2011  . Colposcopy  2011    with egotheraphy. 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 Arthritis Mother   . Hypertension Mother   . Arthritis Father   . Hyperlipidemia Father   . Hypertension Father   . Colon cancer Maternal Grandfather 72  . Breast cancer Paternal Grandmother 7  . Hyperlipidemia Maternal Grandmother   . Hypertension Maternal Grandmother   . Coronary artery disease Maternal Uncle 20  . Hypothyroidism Mother    No hereditary disease.  No cancer of breast, ovary, uterus. No cutaneous leiomyomatosis or renal cell carcinoma.  History   Social History  . Marital Status: Married    Spouse Name: N/A    Number of Children: N/A  . Years of Education: N/A    Occupational History  . Not on file.   Social History Main Topics  . Smoking status: Never Smoker   . 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 Alcohol Use: Yes     Comment: social  . Drug Use: No     Comment: [phone]  . Sexual Activity: Not on file   Other Topics Concern  . Not on file   Social History Narrative  . No narrative on file    No Known Allergies  No current facility-administered medications on file prior to encounter.   Current Outpatient Prescriptions on File Prior to Encounter  Medication Sig Dispense Refill  . benzoyl peroxide-erythromycin (BENZAMYCIN) gel APPLY TO AFFECTED SKIN TWICE A DAY AS NEEDED  46.6 g  0  . buPROPion (WELLBUTRIN XL) 150 MG 24 hr tablet TAKE 1 TABLET BY MOUTH ONCE DAILY  90 tablet  3  . ibuprofen (ADVIL,MOTRIN) 200 MG tablet Take 200 mg by mouth as needed.      . ranitidine (ZANTAC) 150 MG tablet TAKE 1 TABLET BY MOUTH TWICE DAILY  60 tablet  5     Review of Systems  Constitutional: Negative.   HENT: Negative.   Eyes: Negative.   Respiratory: Negative.  Cardiovascular: Negative.   Gastrointestinal: Negative.   Genitourinary: Negative.   Musculoskeletal: Negative.   Skin: Negative.   Neurological: Negative.   Endo/Heme/Allergies: Negative.   Psychiatric/Behavioral: Negative.      Physical Exam  BP 138/85  Pulse 70  Temp(Src) 98.1 F (36.7 C) (Oral)  Resp 20  Ht 5\' 4"  (1.626 m)  Wt 61.689 kg (136 lb)  BMI 23.33 kg/m2  SpO2 100% Constitutional: She is oriented to person, place, and time. She appears well-developed and well-nourished.  HENT:  Head: Normocephalic and atraumatic.  Nose: Nose normal.  Mouth/Throat: Oropharynx is clear and moist. No oropharyngeal exudate.  Eyes: Conjunctivae normal and EOM are normal. Pupils are equal, round, and reactive to light. No scleral icterus.  Neck: Normal range of motion. Neck supple. No tracheal  deviation present. No thyromegaly present.  Cardiovascular: Normal rate.   Respiratory: Effort normal and breath sounds normal.  GI: Soft. Bowel sounds are normal. She exhibits no distension and no mass. There is no tenderness.  Lymphadenopathy:    She has no cervical adenopathy.  Neurological: She is alert and oriented to person, place, and time. She has normal reflexes.  Skin: Skin is warm.  Psychiatric: She has a normal mood and affect. Her behavior is normal. Judgment and thought content normal.    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   Governor Specking

## 2013-04-29 NOTE — Anesthesia Preprocedure Evaluation (Signed)
Anesthesia Evaluation  Patient identified by MRN, date of birth, ID band Patient awake    Reviewed: Allergy & Precautions, H&P , NPO status , Patient's Chart, lab work & pertinent test results  Airway       Dental   Pulmonary neg pulmonary ROS, neg sleep apnea,    Pulmonary exam normal       Cardiovascular negative cardio ROS  Rhythm:Regular Rate:Normal     Neuro/Psych Anxiety negative neurological ROS     GI/Hepatic Neg liver ROS, GERD-  Medicated and Controlled,  Endo/Other  negative endocrine ROS  Renal/GU negative Renal ROS  negative genitourinary   Musculoskeletal negative musculoskeletal ROS (+)   Abdominal   Peds  Hematology negative hematology ROS (+)   Anesthesia Other Findings   Reproductive/Obstetrics                           Anesthesia Physical Anesthesia Plan  ASA: II  Anesthesia Plan: General   Post-op Pain Management:    Induction: Intravenous  Airway Management Planned: LMA  Additional Equipment: None  Intra-op Plan:   Post-operative Plan: Extubation in OR  Informed Consent: I have reviewed the patients History and Physical, chart, labs and discussed the procedure including the risks, benefits and alternatives for the proposed anesthesia with the patient or authorized representative who has indicated his/her understanding and acceptance.   Dental advisory given  Plan Discussed with: CRNA and Surgeon  Anesthesia Plan Comments:         Anesthesia Quick Evaluation

## 2013-04-29 NOTE — Anesthesia Procedure Notes (Signed)
Procedure Name: LMA Insertion Date/Time: 04/29/2013 2:33 PM Performed by: Flossie Dibble Pre-anesthesia Checklist: Patient identified, Emergency Drugs available, Suction available, Patient being monitored and Timeout performed Patient Re-evaluated:Patient Re-evaluated prior to inductionOxygen Delivery Method: Circle system utilized Preoxygenation: Pre-oxygenation with 100% oxygen Intubation Type: IV induction Ventilation: Mask ventilation without difficulty LMA: LMA inserted LMA Size: 4.0 Number of attempts: 1 Placement Confirmation: breath sounds checked- equal and bilateral and positive ETCO2 Tube secured with: Tape Dental Injury: Teeth and Oropharynx as per pre-operative assessment

## 2013-05-02 ENCOUNTER — Encounter (HOSPITAL_COMMUNITY): Payer: Self-pay | Admitting: Obstetrics and Gynecology

## 2013-09-12 ENCOUNTER — Telehealth: Payer: Self-pay | Admitting: *Deleted

## 2013-09-12 DIAGNOSIS — R1033 Periumbilical pain: Secondary | ICD-10-CM

## 2013-09-12 NOTE — Telephone Encounter (Signed)
Patient is having periumbilical abdominal pain. She is scheduled to see Dr Olevia Perches tomorrow at 8:15 am. Per Nicoletta Ba, PA-C, patient should have cbc, serum hcg and CT abd/pelvis. Tests scheduled and patient advised.

## 2013-09-12 NOTE — Telephone Encounter (Signed)
I tried to call her ,left a message for her to call me tonight if she needs to talk.

## 2013-09-13 ENCOUNTER — Ambulatory Visit (HOSPITAL_COMMUNITY)
Admission: RE | Admit: 2013-09-13 | Discharge: 2013-09-13 | Disposition: A | Discharge status: Home / Self Care | Payer: 59 | Source: Ambulatory Visit | Attending: Internal Medicine | Admitting: Internal Medicine

## 2013-09-13 ENCOUNTER — Encounter (HOSPITAL_COMMUNITY): Payer: Self-pay

## 2013-09-13 ENCOUNTER — Encounter: Payer: Self-pay | Admitting: Internal Medicine

## 2013-09-13 ENCOUNTER — Other Ambulatory Visit (INDEPENDENT_AMBULATORY_CARE_PROVIDER_SITE_OTHER): Payer: 59

## 2013-09-13 ENCOUNTER — Ambulatory Visit: Admission: RE | Admit: 2013-09-13 | Payer: 59 | Source: Ambulatory Visit

## 2013-09-13 ENCOUNTER — Ambulatory Visit (INDEPENDENT_AMBULATORY_CARE_PROVIDER_SITE_OTHER): Payer: 59 | Admitting: Internal Medicine

## 2013-09-13 VITALS — BP 114/70 | HR 68 | Ht 63.75 in | Wt 143.1 lb

## 2013-09-13 DIAGNOSIS — M5144 Schmorl's nodes, thoracic region: Secondary | ICD-10-CM | POA: Insufficient documentation

## 2013-09-13 DIAGNOSIS — R1033 Periumbilical pain: Secondary | ICD-10-CM

## 2013-09-13 DIAGNOSIS — N839 Noninflammatory disorder of ovary, fallopian tube and broad ligament, unspecified: Secondary | ICD-10-CM | POA: Insufficient documentation

## 2013-09-13 DIAGNOSIS — K389 Disease of appendix, unspecified: Secondary | ICD-10-CM | POA: Insufficient documentation

## 2013-09-13 LAB — CBC WITH DIFFERENTIAL/PLATELET
Basophils Absolute: 0 10*3/uL (ref 0.0–0.1)
Basophils Relative: 0.5 % (ref 0.0–3.0)
Eosinophils Absolute: 0.2 10*3/uL (ref 0.0–0.7)
Eosinophils Relative: 3 % (ref 0.0–5.0)
HCT: 39.4 % (ref 36.0–46.0)
Hemoglobin: 13.4 g/dL (ref 12.0–15.0)
LYMPHS PCT: 39.7 % (ref 12.0–46.0)
Lymphs Abs: 2.4 10*3/uL (ref 0.7–4.0)
MCHC: 34 g/dL (ref 30.0–36.0)
MCV: 87.4 fl (ref 78.0–100.0)
MONOS PCT: 7.1 % (ref 3.0–12.0)
Monocytes Absolute: 0.4 10*3/uL (ref 0.1–1.0)
NEUTROS PCT: 49.7 % (ref 43.0–77.0)
Neutro Abs: 3 10*3/uL (ref 1.4–7.7)
PLATELETS: 157 10*3/uL (ref 150.0–400.0)
RBC: 4.51 Mil/uL (ref 3.87–5.11)
RDW: 13.7 % (ref 11.5–15.5)
WBC: 6 10*3/uL (ref 4.0–10.5)

## 2013-09-13 LAB — HCG, QUANTITATIVE, PREGNANCY: QUANTITATIVE HCG: 0.23 m[IU]/mL

## 2013-09-13 MED ORDER — IOHEXOL 300 MG/ML  SOLN
80.0000 mL | Freq: Once | INTRAMUSCULAR | Status: AC | PRN
  Administered 2013-09-13: 80 mL via INTRAVENOUS

## 2013-09-13 NOTE — Patient Instructions (Signed)
CT scan today CBC,HCG test pending, then decide further disposition

## 2013-09-13 NOTE — Progress Notes (Signed)
Lynn West February 19, 1986 [id number]  Note: This dictation was prepared with Dragon digital system. Any transcriptional errors that result from this procedure are unintentional.   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 It usually goes away at night. It is worse within an hour of meals but her appetite has remained good. There has been no change in her bowel habits which are somewhat constipated and for which she takes a daily stool softener. She denies taking any anti-inflammatory agents. There has been no fever or weight changes. She has been trying to get pregnant. There is no history of endometriosis. Her pregnancy test as well as CBC and CT scan are pending. She denies any urinary symptoms. There is no pre-existing GI history such as Crohn's disease, bowel obstruction or celiac disease. She has never undergone any imaging of her abdomen. The pain has remained to the left of the umbilicus and has not radiated to the right upper or right lower quadrants.    Past Medical History  Diagnosis Date  . History of chicken pox     childhood  . Headaches, cluster     stress related  . GERD (gastroesophageal reflux disease)   . 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 Anxiety     Past Surgical History  Procedure Laterality Date  . Wisdom tooth extraction  2003  . Vaginal septoplasty  2004  . Mastopexy  2011  . Colposcopy  2011    with egotheraphy. 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 Hysteroscopy w/d&c N/A 04/29/2013    Procedure: DILATATION AND CURETTAGE /HYSTEROSCOPY WITH EXCISION OF SEPTUM;  Surgeon: Governor Specking, MD;  Location: [facility];  Service: Gynecology;  Laterality: N/A;    No Known Allergies  Family history and social history have been reviewed.  Review of  Systems: Positive for gastroesophageal reflux.  The remainder of the 10 point ROS is negative except as outlined in the H&P  Physical Exam: General Appearance Well developed, in no distress Eyes  Non icteric  HEENT  Non traumatic, normocephalic  Mouth No lesion, tongue papillated, no cheilosis Neck Supple without adenopathy, thyroid not enlarged, no carotid bruits, no JVD Lungs Clear to auscultation bilaterally COR Normal S1, normal S2, regular rhythm, no murmur, quiet precordium Abdomen soft with normoactive bowel sounds. Mild tenderness to the level of the umbilicus over the area of about the 2 inch diameter. No distention. Active aortic pulsations. No bruit. Left lower and right lower quadrant are unremarkable. Epigastric area is normal. Liver edge at costal margin. There is no CVA tenderness. Rectal declined. Will do Hemoccult cards. Extremities  No pedal edema Skin No lesions Neurological Alert and oriented x 3 Psychological Normal mood and affect  Assessment and Plan:   Problem #1 acute  Localized mid  abdominal pain associated with meals but not interfering with her eating habits or bowel habits. Small bowel ulceration, intussusception or small bowel diverticulum are possibilities. Crohns disease would be remotely considered. We will await results of the CT scan which will be done later today and await results of the blood test as well. I have offered Levsin sublingually 0.125 mg to take when necessary but she elected to wait until CT scan results are available. Ectopic pregnancy or pelvic ovarian cyst are other  possibilities as well as endometriosis although the location of the pain is outside of the pelvis.CT scan will r/o constipation as well as appendicitis or cholecystitis.    Delfin Edis 09/13/2013

## 2013-10-17 ENCOUNTER — Telehealth: Payer: Self-pay | Admitting: *Deleted

## 2013-10-17 ENCOUNTER — Other Ambulatory Visit (INDEPENDENT_AMBULATORY_CARE_PROVIDER_SITE_OTHER): Payer: 59

## 2013-10-17 ENCOUNTER — Other Ambulatory Visit: Payer: Self-pay | Admitting: Internal Medicine

## 2013-10-17 DIAGNOSIS — N9489 Other specified conditions associated with female genital organs and menstrual cycle: Secondary | ICD-10-CM

## 2013-10-17 DIAGNOSIS — R102 Pelvic and perineal pain: Secondary | ICD-10-CM

## 2013-10-17 LAB — URINALYSIS, ROUTINE W REFLEX MICROSCOPIC
BILIRUBIN URINE: NEGATIVE
Ketones, ur: NEGATIVE
LEUKOCYTES UA: NEGATIVE
NITRITE: POSITIVE — AB
SPECIFIC GRAVITY, URINE: 1.025 (ref 1.000–1.030)
Total Protein, Urine: NEGATIVE
Urine Glucose: NEGATIVE
Urobilinogen, UA: 0.2 (ref 0.0–1.0)
pH: 5.5 (ref 5.0–8.0)

## 2013-10-17 MED ORDER — CIPROFLOXACIN HCL 250 MG PO TABS: 250.0000 mg | ORAL_TABLET | Freq: Two times a day (BID) | ORAL | Status: DC

## 2013-10-17 NOTE — Telephone Encounter (Signed)
Message copied by Larina Bras on Mon Oct 17, 2013 12:44 PM ------      Message from: Nicoletta Ba S      Created: Mon Oct 17, 2013 12:43 PM       Please send Rx for Cipro 250 mg twice daily x 5 days, will need to follow up culture ------

## 2013-10-17 NOTE — Telephone Encounter (Signed)
Culture is pending. Rx for cipro sent to pharmacy. Patient advised.

## 2013-10-18 ENCOUNTER — Other Ambulatory Visit: Payer: Self-pay | Admitting: *Deleted

## 2013-10-18 DIAGNOSIS — N390 Urinary tract infection, site not specified: Secondary | ICD-10-CM

## 2013-10-21 ENCOUNTER — Other Ambulatory Visit (INDEPENDENT_AMBULATORY_CARE_PROVIDER_SITE_OTHER): Payer: 59

## 2013-10-21 ENCOUNTER — Other Ambulatory Visit: Payer: Self-pay | Admitting: Physician Assistant

## 2013-10-21 DIAGNOSIS — N390 Urinary tract infection, site not specified: Secondary | ICD-10-CM

## 2013-10-21 LAB — URINE CULTURE: Colony Count: >=100000

## 2013-10-21 LAB — HCG, QUANTITATIVE, PREGNANCY: QUANTITATIVE HCG: 461.04 m[IU]/mL

## 2013-10-24 ENCOUNTER — Other Ambulatory Visit: Payer: Self-pay | Admitting: *Deleted

## 2013-10-24 DIAGNOSIS — Z3401 Encounter for supervision of normal first pregnancy, first trimester: Secondary | ICD-10-CM

## 2013-10-24 LAB — URINALYSIS, ROUTINE W REFLEX MICROSCOPIC
Bilirubin Urine: NEGATIVE
LEUKOCYTES UA: NEGATIVE
Nitrite: NEGATIVE
PH: 5 (ref 5.0–8.0)
Urine Glucose: NEGATIVE
Urobilinogen, UA: 0.2 (ref 0.0–1.0)

## 2013-10-28 ENCOUNTER — Other Ambulatory Visit (INDEPENDENT_AMBULATORY_CARE_PROVIDER_SITE_OTHER): Payer: 59

## 2013-10-28 DIAGNOSIS — Z3401 Encounter for supervision of normal first pregnancy, first trimester: Secondary | ICD-10-CM

## 2013-10-28 DIAGNOSIS — Z3400 Encounter for supervision of normal first pregnancy, unspecified trimester: Secondary | ICD-10-CM

## 2013-10-29 LAB — HCG, QUANTITATIVE, PREGNANCY: QUANTITATIVE HCG: 10071 m[IU]/mL

## 2013-11-01 ENCOUNTER — Telehealth: Payer: Self-pay | Admitting: *Deleted

## 2013-11-01 MED ORDER — PANTOPRAZOLE SODIUM 40 MG PO TBEC: 40.0000 mg | DELAYED_RELEASE_TABLET | Freq: Every day | ORAL | Status: DC

## 2013-11-01 NOTE — Telephone Encounter (Signed)
Per Dr Olevia Perches, send PPI for patient. Rx has been sent.

## 2013-11-30 ENCOUNTER — Other Ambulatory Visit: Payer: Self-pay | Admitting: Internal Medicine

## 2014-03-10 NOTE — L&D Delivery Note (Addendum)
Delivery Note  First Stage: Labor onset: 0700 Augmentation : none Analgesia /Anesthesia intrapartum: none SROM at 0630  Second Stage: Complete dilation at 0920 Onset of pushing at 0930 FHR second stage 125  Delivery of a viable female at [phone] by CNM in ROA position Loose Nuchal cord x 1  Cord double clamped after cessation of pulsation, cut by FOB Cord blood sample collected   Third Stage: Placenta delivered via Schultz intact with 3 VC @ [phone] Placenta disposition: L&D Uterine tone firm / bleeding moderate / brisk  2nd degree perineal (repaired by R. Renato Battles, CNM), deep RT sulcus and bilateral labia minora lacerations (repaired by Dr. Pamala Hurry) identified  Anesthesia for repair: 25 mL 1% Lidocaine Repair 2.0, 3.0 x 2 vicryl, 4.0 x 2 monocryl Est. Blood Loss (mL): 676  Complications: precipitous vaginal delivery  Mom to postpartum.  Baby to Couplet care / Skin to Skin.  Newborn: Birth Weight: 6 lbs 8.8 oz  Apgar Scores: 9/9 Feeding planned: breast  Laury Deep, M  MSN, CNM 06/13/2014, 1:49 PM  After 2nd degree perineal laceration repaired by R. Renato Battles, I called to assess bleeding sulcal tear with exposed perirectal fat. Using vaginal wall retractors, and after local anaesthetic injected, apex of sulcal laceration visualized and 2-0 vicryl used to close laceration in a running locked suture. Care was taken not to incorporate perirectal fat in the suture.  Rectal exam done to confirm rectum intact. Bilateral periurethral lacerations were closed with running 4-0 vicryl rapide. Good hemostasis at the conclusion of the repair.  Charlayne Vultaggio A. 06/13/2014 2:53 PM

## 2014-06-13 ENCOUNTER — Inpatient Hospital Stay (HOSPITAL_COMMUNITY)
Admission: AD | Admit: 2014-06-13 | Discharge: 2014-06-15 | DRG: 775 | Disposition: A | Discharge status: Home / Self Care | Payer: 59 | Source: Ambulatory Visit | Attending: Obstetrics | Admitting: Obstetrics

## 2014-06-13 ENCOUNTER — Encounter (HOSPITAL_COMMUNITY): Payer: Self-pay | Admitting: *Deleted

## 2014-06-13 DIAGNOSIS — O9081 Anemia of the puerperium: Secondary | ICD-10-CM | POA: Diagnosis present

## 2014-06-13 DIAGNOSIS — O9912 Other diseases of the blood and blood-forming organs and certain disorders involving the immune mechanism complicating childbirth: Secondary | ICD-10-CM | POA: Diagnosis present

## 2014-06-13 DIAGNOSIS — K219 Gastro-esophageal reflux disease without esophagitis: Secondary | ICD-10-CM | POA: Diagnosis present

## 2014-06-13 DIAGNOSIS — D693 Immune thrombocytopenic purpura: Secondary | ICD-10-CM | POA: Diagnosis present

## 2014-06-13 DIAGNOSIS — D696 Thrombocytopenia, unspecified: Secondary | ICD-10-CM | POA: Diagnosis present

## 2014-06-13 DIAGNOSIS — Z3A37 37 weeks gestation of pregnancy: Secondary | ICD-10-CM | POA: Diagnosis present

## 2014-06-13 DIAGNOSIS — IMO0001 Reserved for inherently not codable concepts without codable children: Secondary | ICD-10-CM

## 2014-06-13 DIAGNOSIS — D62 Acute posthemorrhagic anemia: Secondary | ICD-10-CM | POA: Diagnosis present

## 2014-06-13 DIAGNOSIS — O9962 Diseases of the digestive system complicating childbirth: Secondary | ICD-10-CM | POA: Diagnosis present

## 2014-06-13 DIAGNOSIS — O99119 Other diseases of the blood and blood-forming organs and certain disorders involving the immune mechanism complicating pregnancy, unspecified trimester: Secondary | ICD-10-CM

## 2014-06-13 DIAGNOSIS — Z8249 Family history of ischemic heart disease and other diseases of the circulatory system: Secondary | ICD-10-CM

## 2014-06-13 HISTORY — DX: Thrombocytopenia, unspecified: D69.6

## 2014-06-13 HISTORY — DX: Reserved for inherently not codable concepts without codable children: IMO0001

## 2014-06-13 HISTORY — DX: Other diseases of the blood and blood-forming organs and certain disorders involving the immune mechanism complicating pregnancy, unspecified trimester: O99.119

## 2014-06-13 LAB — CBC
HCT: 41.8 % (ref 36.0–46.0)
Hemoglobin: 14.5 g/dL (ref 12.0–15.0)
MCH: 30.7 pg (ref 26.0–34.0)
MCHC: 34.7 g/dL (ref 30.0–36.0)
MCV: 88.4 fL (ref 78.0–100.0)
PLATELETS: 136 10*3/uL — AB (ref 150–400)
RBC: 4.73 MIL/uL (ref 3.87–5.11)
RDW: 14.1 % (ref 11.5–15.5)
WBC: 15.1 10*3/uL — AB (ref 4.0–10.5)

## 2014-06-13 LAB — TYPE AND SCREEN
ABO/RH(D): A POS
ANTIBODY SCREEN: NEGATIVE

## 2014-06-13 LAB — ABO/RH: ABO/RH(D): A POS

## 2014-06-13 MED ORDER — DIPHENHYDRAMINE HCL 25 MG PO CAPS: 25.0000 mg | ORAL_CAPSULE | Freq: Four times a day (QID) | ORAL | Status: DC | PRN

## 2014-06-13 MED ORDER — TETANUS-DIPHTH-ACELL PERTUSSIS 5-2.5-18.5 LF-MCG/0.5 IM SUSP: 0.5000 mL | Freq: Once | INTRAMUSCULAR | Status: DC

## 2014-06-13 MED ORDER — OXYCODONE-ACETAMINOPHEN 5-325 MG PO TABS: 2.0000 | ORAL_TABLET | ORAL | Status: DC | PRN

## 2014-06-13 MED ORDER — ONDANSETRON HCL 4 MG PO TABS: 4.0000 mg | ORAL_TABLET | ORAL | Status: DC | PRN

## 2014-06-13 MED ORDER — CITRIC ACID-SODIUM CITRATE 334-500 MG/5ML PO SOLN: 30.0000 mL | ORAL | Status: DC | PRN

## 2014-06-13 MED ORDER — IBUPROFEN 600 MG PO TABS
600.0000 mg | ORAL_TABLET | Freq: Four times a day (QID) | ORAL | Status: DC
  Administered 2014-06-13 – 2014-06-15 (×7): 600 mg via ORAL
  Filled 2014-06-13 (×7): qty 1

## 2014-06-13 MED ORDER — ACETAMINOPHEN 325 MG PO TABS: 650.0000 mg | ORAL_TABLET | ORAL | Status: DC | PRN

## 2014-06-13 MED ORDER — FLEET ENEMA 7-19 GM/118ML RE ENEM: 1.0000 | ENEMA | RECTAL | Status: DC | PRN

## 2014-06-13 MED ORDER — LIDOCAINE HCL (PF) 1 % IJ SOLN
INTRAMUSCULAR | Status: AC
  Filled 2014-06-13: qty 30

## 2014-06-13 MED ORDER — LIDOCAINE HCL (PF) 1 % IJ SOLN
30.0000 mL | Freq: Once | INTRAMUSCULAR | Status: AC
  Administered 2014-06-13: 30 mL via INTRADERMAL

## 2014-06-13 MED ORDER — LACTATED RINGERS IV SOLN
500.0000 mL | INTRAVENOUS | Status: DC | PRN
Start: 2014-06-13 — End: 2014-06-13

## 2014-06-13 MED ORDER — ZOLPIDEM TARTRATE 5 MG PO TABS: 5.0000 mg | ORAL_TABLET | Freq: Every evening | ORAL | Status: DC | PRN

## 2014-06-13 MED ORDER — WITCH HAZEL-GLYCERIN EX PADS: 1.0000 "application " | MEDICATED_PAD | CUTANEOUS | Status: DC | PRN

## 2014-06-13 MED ORDER — ONDANSETRON HCL 4 MG/2ML IJ SOLN: 4.0000 mg | INTRAMUSCULAR | Status: DC | PRN

## 2014-06-13 MED ORDER — OXYTOCIN BOLUS FROM INFUSION: 500.0000 mL | INTRAVENOUS | Status: DC

## 2014-06-13 MED ORDER — BENZOCAINE-MENTHOL 20-0.5 % EX AERO
1.0000 "application " | INHALATION_SPRAY | CUTANEOUS | Status: DC | PRN
  Administered 2014-06-13: 1 via TOPICAL
  Filled 2014-06-13: qty 56

## 2014-06-13 MED ORDER — PRENATAL MULTIVITAMIN CH
1.0000 | ORAL_TABLET | Freq: Every day | ORAL | Status: DC
  Administered 2014-06-14: 1 via ORAL
  Filled 2014-06-13: qty 1

## 2014-06-13 MED ORDER — SIMETHICONE 80 MG PO CHEW: 80.0000 mg | CHEWABLE_TABLET | ORAL | Status: DC | PRN

## 2014-06-13 MED ORDER — OXYCODONE-ACETAMINOPHEN 5-325 MG PO TABS: 1.0000 | ORAL_TABLET | ORAL | Status: DC | PRN

## 2014-06-13 MED ORDER — DIBUCAINE 1 % RE OINT: 1.0000 "application " | TOPICAL_OINTMENT | RECTAL | Status: DC | PRN

## 2014-06-13 MED ORDER — LACTATED RINGERS IV BOLUS (SEPSIS): 300.0000 mL | Freq: Once | INTRAVENOUS | Status: DC

## 2014-06-13 MED ORDER — OXYTOCIN 40 UNITS IN LACTATED RINGERS INFUSION - SIMPLE MED
62.5000 mL/h | INTRAVENOUS | Status: DC
  Administered 2014-06-13: 62.5 mL/h via INTRAVENOUS
  Filled 2014-06-13: qty 1000

## 2014-06-13 MED ORDER — METHYLERGONOVINE MALEATE 0.2 MG/ML IJ SOLN: 0.2000 mg | INTRAMUSCULAR | Status: DC | PRN

## 2014-06-13 MED ORDER — FERROUS SULFATE 325 (65 FE) MG PO TABS
325.0000 mg | ORAL_TABLET | Freq: Two times a day (BID) | ORAL | Status: DC
  Administered 2014-06-13 – 2014-06-15 (×4): 325 mg via ORAL
  Filled 2014-06-13 (×4): qty 1

## 2014-06-13 MED ORDER — METHYLERGONOVINE MALEATE 0.2 MG PO TABS: 0.2000 mg | ORAL_TABLET | ORAL | Status: DC | PRN

## 2014-06-13 MED ORDER — SENNOSIDES-DOCUSATE SODIUM 8.6-50 MG PO TABS
2.0000 | ORAL_TABLET | ORAL | Status: DC
  Administered 2014-06-13 – 2014-06-14 (×2): 2 via ORAL
  Filled 2014-06-13 (×2): qty 2

## 2014-06-13 MED ORDER — ONDANSETRON HCL 4 MG/2ML IJ SOLN: 4.0000 mg | Freq: Four times a day (QID) | INTRAMUSCULAR | Status: DC | PRN

## 2014-06-13 MED ORDER — LIDOCAINE HCL (PF) 1 % IJ SOLN
30.0000 mL | INTRAMUSCULAR | Status: AC | PRN
  Administered 2014-06-13 (×2): 30 mL via SUBCUTANEOUS
  Filled 2014-06-13 (×2): qty 30

## 2014-06-13 MED ORDER — LACTATED RINGERS IV SOLN
INTRAVENOUS | Status: DC
  Administered 2014-06-13: 09:00:00 via INTRAVENOUS

## 2014-06-13 NOTE — Progress Notes (Signed)
MOB was referred for history of depression/anxiety.  Referral is screened out by Clinical Social Worker because none of the following criteria appear to apply: -History of anxiety/depression during this pregnancy, or of post-partum depression. - Diagnosis of anxiety and/or depression within last 3 years or -MOB's symptoms are currently being treated with medication and/or therapy.  CSW completed chart review.  MOB presents with history of anxiety and medication management since 2010.  MOB is currently not prescribed any medication, no symptoms noted/documented during the pregnancy.   Please contact the Clinical Social Worker if needs arise or upon MOB request.   Lynn West, [place] [place] [facility] [phone]

## 2014-06-13 NOTE — Progress Notes (Signed)
Pt assisted to BR, able to void.  States she was light headed then proceeded to pass out while on the toilet.  After using 2 ammonia inhalants pt slowly gained consciousness.  Pt assisted back to bed via steady.

## 2014-06-13 NOTE — Lactation Note (Addendum)
This note was copied from the chart of Lynn Karisha Marlin. Lactation Consultation Note  Patient Name: Lynn West [id number] Date: 06/13/2014 Reason for consult: Initial assessment;Breast surgery  Baby 11 hours of life. Mom reports that she had mastopexy in 2011 and both nipples were removed and moved up higher on breast. LC had this couple in BF class in March, so reviewed how surgery can impact BF. Mom reports that baby not wanting to stay at the breast for very long. Baby has had multiple short feeds. Mom states that baby has spit up several times, including a large amount during his bath. Discussed how excess fluid can cause baby to feel full and not want to nurse. Enc mom to offer lots of STS and attempts at breast, nursing with cues and at least 8-12 times/day. Set mom up with DEBP with instructions for use and enc to pump at least once tonight, and then post-pump after offering breast at least 4-6 times tomorrow, and after each poor/short feeding. Enc mom to hand express prior to latching baby and following pumping. Mom is able to hand express colostrum. Enc mom to give baby any colostrum she is able to get pumping/hand expressing. Mom given small bottle for EBM collection, spoons, and curve tipped syringe with instructions for feeding as well. Mom given [facility] brochure, aware of OP/BFSG, community resources, and [facility] phone line assistance after D/C. Mom states that she already has her DEBP at home. Discussed assessment, interventions, and plan with patient's MBU RN [place]. Maternal Data Has patient been taught Hand Expression?: Yes Does the patient have breastfeeding experience prior to this delivery?: No  Feeding Feeding Type: Breast Fed Length of feed: 0 min  LATCH Score/Interventions Latch: Too sleepy or reluctant, no latch achieved, no sucking elicited. Intervention(s): Skin to skin;Waking techniques  Audible Swallowing: None  Type of Nipple: Everted at rest and after stimulation  Comfort  (Breast/Nipple): Soft / non-tender     Hold (Positioning): Assistance needed to correctly position infant at breast and maintain latch.  LATCH Score: 5  Lactation Tools Discussed/Used Pump Review: Setup, frequency, and cleaning Initiated by:: JW Date initiated:: 06/13/14   Consult Status Consult Status: Follow-up Date: 06/14/14 Follow-up type: In-patient    Inocente Salles 06/13/2014, 9:14 PM

## 2014-06-13 NOTE — Progress Notes (Signed)
Patient ID: Lynn West, female   DOB: [birth year], 29 y.o.   MRN: [id number] S: Doing well, no pain management, requesting IV pain medication. (+) pelvic pressure with every contraction  O: Filed Vitals:   06/13/14 1230 06/13/14 1231 06/13/14 1236 06/13/14 1245  BP:  109/68 115/69 112/79  Pulse: 117 111 106 111  Temp:      TempSrc:      Resp:  20 20   Height:      Weight:      SpO2: 99%  100% 100%     FHT:  FHR: 125 bpm, variability: moderate,  accelerations:  Present,  decelerations:  Present variables UC:   regular, every 2-2.5 minutes SVE:   Dilation: 10 Effacement (%): 100 Station: +1 Exam by:: Renato Battles, CNM   A / P: Spontaneous labor, progressing normally SROM - clear fluid  Fetal Wellbeing:  Category I Pain Control:  Labor support without medications  Anticipated MOD:  NSVD  Graceann Congress, MSN, CNM 06/13/2014, 9:28 AM

## 2014-06-13 NOTE — H&P (Signed)
OB ADMISSION/ HISTORY & PHYSICAL:  Admission Date: 06/13/2014  8:42 AM  Admit Diagnosis: SROM / Active Labor / Advanced Dilation  Lynn West is a 29 y.o. female presenting for SROM & active labor.  Prenatal History: G1P1001   EDC : 06/28/2014, by Last Menstrual Period  Prenatal care at [facility] since [redacted] weeks gestation Primary Care Provider at [facility]: Dr. Benjie Karvonen  Prenatal course complicated by Gestational Thrombocytopenia  Prenatal Labs: ABO, Rh:   A Positive Antibody: NEG (04/05 0925) Rubella:   Immune RPR:   Non-Reactive HBsAg:   Non-Reactive HIV:   Non-Reactive GBS:   Negative 1 hr Glucola : Abnormal / Normal 3 hr GTT   Medical / Surgical History :  Past medical history:  Past Medical History  Diagnosis Date  . History of chicken pox     childhood  . Headaches, cluster     stress related  . GERD (gastroesophageal reflux disease)   . 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 Anxiety   . Active labor at term 06/13/2014  . Postpartum care following vaginal delivery (4/5) 06/13/2014  . Gestational thrombocytopenia 06/13/2014     Past surgical history:  Past Surgical History  Procedure Laterality Date  . Wisdom tooth extraction  2003  . Vaginal septoplasty  2004  . Mastopexy  2011  . Colposcopy  2011    with egotheraphy. 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 Hysteroscopy w/d&c N/A 04/29/2013    Procedure: DILATATION AND CURETTAGE /HYSTEROSCOPY WITH EXCISION OF SEPTUM;  Surgeon: Governor Specking, MD;  Location: [facility];  Service: Gynecology;  Laterality: N/A;     Family History:  Family History  Problem Relation Age of Onset  . Arthritis Mother   . Hypertension Mother   . Arthritis Father   . Hyperlipidemia Father   . Hypertension Father   . Colon cancer Maternal Grandfather 3  . Breast cancer Paternal Grandmother 82  . Hyperlipidemia Maternal Grandmother   . Hypertension Maternal Grandmother   . Coronary artery  disease Maternal Uncle 20  . Hypothyroidism Mother      Social History:  reports that she has never smoked. She has never used smokeless tobacco. She reports that she drinks alcohol. She reports that she does not use illicit drugs.   Allergies: Review of patient's allergies indicates no known allergies.    Current Medications at time of admission:  Prescriptions prior to admission  Medication Sig Dispense Refill Last Dose  . benzoyl peroxide-erythromycin (BENZAMYCIN) gel APPLY TO AFFECTED SKIN TWICE A DAY AS NEEDED. MUST BE SEEN FOR MORE REFILLS. 46.6 g 0   . ciprofloxacin (CIPRO) 250 MG tablet Take 1 tablet (250 mg total) by mouth 2 (two) times daily. 10 tablet 0   . docusate sodium (COLACE) 100 MG capsule Take 200 mg by mouth 2 (two) times daily.   Taking  . ibuprofen (ADVIL,MOTRIN) 200 MG tablet Take 200 mg by mouth as needed.   Taking  . pantoprazole (PROTONIX) 40 MG tablet Take 1 tablet (40 mg total) by mouth daily. 90 tablet 3   . Prenatal Vit-Fe Fumarate-FA (PRENATAL MULTIVITAMIN) TABS tablet Take 1 tablet by mouth daily at 12 noon.   Taking      Review of Systems: Review of Systems  Constitutional: Negative.   HENT: Negative.   Eyes: Negative.   Respiratory: Negative.   Cardiovascular: Negative.   Gastrointestinal: Negative.   Genitourinary:  Leaking fluid since 0630; contractions since 0700, UC's very close and painful now; (+) FM  Musculoskeletal: Negative.   Skin: Negative.   Neurological: Negative.   Endo/Heme/Allergies: Negative.   Psychiatric/Behavioral: Negative.      Physical Exam:  Dilation: 10 Effacement (%): 100 Station: +1 Exam by:: Renato Battles, CNM  Vitals: BP: 129/81, P: 92, R: 18, T: 98.4  General: A&O x 3, NAD Heart: RRR, no murmurs Lungs: CTAB  Abdomen: gravid, firm with contractions, normal bowel sounds Extremities: atraumatic, normal ROM, no edema Genitalia / VE: 10/100%/+1/vtx FHR: 125 / moderate variability / accels present /  variables TOCO: regular every 2-2.5 mins  Labs:     Recent Labs  06/13/14 0925  WBC 15.1*  HGB 14.5  HCT 41.8  PLT 136*     Assessment:  29 y.o. G1P1001 at [redacted]w[redacted]d  1. Labor: Active - 2nd stage 2. Fetal Wellbeing: Category 1  3. Pain Control: none 4. GBS: Negative 5. Gestational Thrombocytopenia  Plan:  1. Admit to BS 2. Routine L&D orders    Dr Pamala Hurry notified of admission / plan of care    Graceann Congress, MSN, CNM 06/13/2014, 1:34 PM

## 2014-06-13 NOTE — MAU Note (Signed)
Brought from car to room #5. Leaking fluid.

## 2014-06-14 DIAGNOSIS — D62 Acute posthemorrhagic anemia: Secondary | ICD-10-CM | POA: Diagnosis not present

## 2014-06-14 LAB — CBC
HCT: 25.9 % — ABNORMAL LOW (ref 36.0–46.0)
HEMATOCRIT: 25 % — AB (ref 36.0–46.0)
Hemoglobin: 8.7 g/dL — ABNORMAL LOW (ref 12.0–15.0)
Hemoglobin: 8.9 g/dL — ABNORMAL LOW (ref 12.0–15.0)
MCH: 31.2 pg (ref 26.0–34.0)
MCH: 30.7 pg (ref 26.0–34.0)
MCHC: 34.8 g/dL (ref 30.0–36.0)
MCHC: 34.4 g/dL (ref 30.0–36.0)
MCV: 89.6 fL (ref 78.0–100.0)
MCV: 89.3 fL (ref 78.0–100.0)
PLATELETS: 114 10*3/uL — AB (ref 150–400)
PLATELETS: 142 10*3/uL — AB (ref 150–400)
RBC: 2.79 MIL/uL — AB (ref 3.87–5.11)
RBC: 2.9 MIL/uL — ABNORMAL LOW (ref 3.87–5.11)
RDW: 14.2 % (ref 11.5–15.5)
RDW: 14.5 % (ref 11.5–15.5)
WBC: 18.6 10*3/uL — ABNORMAL HIGH (ref 4.0–10.5)
WBC: 19.9 10*3/uL — ABNORMAL HIGH (ref 4.0–10.5)

## 2014-06-14 LAB — RPR: RPR: NONREACTIVE

## 2014-06-14 MED ORDER — MAGNESIUM OXIDE 400 (241.3 MG) MG PO TABS
200.0000 mg | ORAL_TABLET | Freq: Every day | ORAL | Status: DC
  Administered 2014-06-14: 200 mg via ORAL
  Filled 2014-06-14 (×2): qty 0.5

## 2014-06-14 NOTE — Progress Notes (Signed)
PPD #1- SVD  Subjective:   Reports feeling well, perineum sore Tolerating po/ No nausea or vomiting No dizziness, mild SOB a few times with ambulation Bleeding is light, moderate Pain controlled with Motrin Up ad lib / ambulatory / voiding without problems Newborn: breastfeeding-baby sleepy, feeding poorly  / Circumcision: done   Objective:   VS:  VS:  Filed Vitals:   06/13/14 1330 06/13/14 1430 06/13/14 1815 06/14/14 0522  BP: 115/69 126/83 132/84 123/74  Pulse: 118 128 101 81  Temp: 97.9 F (36.6 C) 98.1 F (36.7 C) 97.8 F (36.6 C) 97.9 F (36.6 C)  TempSrc: Oral Oral Oral Oral  Resp: 18 18 18 18   Height:      Weight:      SpO2:        LABS:  Recent Labs  06/13/14 0925 06/14/14 0546  WBC 15.1* 18.6*  HGB 14.5 8.7*  PLT 136* 114*   Blood type: --/--/A POS, A POS (04/05 0925) Rubella:   Immune  I&O: Intake/Output      04/05 0701 - 04/06 0700 04/06 0701 - 04/07 0700   Blood 400    Total Output 400     Net -400            Physical Exam: Alert and oriented x3 Abdomen: soft, non-tender, non-distended  Fundus: firm, non-tender, U-2 Perineum: Well approximated, no significant erythema, edema, or drainage; healing well, ice pk in place Lochia: moderate Extremities: trace BLE edema, no calf pain or tenderness    Assessment:  PPD #1 G1P1001/ S/P:spontaneous vaginal, 2nd degree laceration, periurethral laceration ABL anemia  Gestational thrombocytopenia, delivered-stable Doing well    Plan: Start Niferex Increase po hydration Continue routine post partum orders Anticipate D/C home tomorrow   Lynn West, N MSN, CNM 06/14/2014, 11:49 AM

## 2014-06-14 NOTE — Lactation Note (Signed)
This note was copied from the chart of Lynn West. Lactation Consultation Note  Patient Name: Lynn West UPBDH'D Date: 06/14/2014 Reason for consult: Follow-up assessment   Follow-up at 37 hours of life.  GA 37.6.  Infant has breastfed x5 (10-15 ml) + x10 (0-8 min); voids-3; stools-6.  LS7-8 by RN.  Last stool brown in color.  Last night at 73 hr old weighing - 2% weight loss.   Mom has history of breast surgery in past (mastopexy).  Scarring noted around areola. Infant fed an hour ago for 10 minutes but was still awake and showing early feeding cues.   Mom latched infant on left side in cross-cradle hold with minimal assistance for attaining depth from [facility].  Taught asymmetrical latching technique with flanging of bottom lip.  Taught dad how to assist using teacup hold and chin tug.   LS-8.  Few swallows heard.  Infant fed slowly for 20 minutes.  Hand expression reviewed with observation of colostrum easily expressed.  Encouraged HE at beginning of feeding to start flow of milk and at end of feeding for breast care. Mom reports using DEBP x3 over past 24 hours with few drops expressed.  Encouraged mom to continue using DEBP during the day for extra stimulation and giving any EBM back to baby.   Educated on cluster feeding and supply & demand.  Reviewed Early Term Birth and potential behaviors.   Encouraged continuing to feed with cues but also waking infant as needed for feedings of at least 8-12 in 24 hours. Educated on cluster feeding.    Based on mom's history of breast surgery, discussed potential medical need to supplement and discussed setting up with SNS if needed.  Parents receptive. RN in at end of feeding to weigh infant.  [place] left room, and RN notified LC that weight loss is now 6.6% at 38 hours.  Reported off to night LC.  Encouraged plan of hand expressing more as a supplementation and potentially giving formula via SNS based on tonight feeds and whether more frequent swallows  are heard during the night.  Discussed potentially re-weighing infant in morning as an indicator of effectiveness of tonight's feeds and mom's expected increase in milk supply.    Feeding Feeding Type: Breast Fed Length of feed: 10 min  LATCH Score/Interventions Latch: Grasps breast easily, tongue down, lips flanged, rhythmical sucking. Intervention(s): Skin to skin Intervention(s): Breast compression  Audible Swallowing: A few with stimulation Intervention(s): Skin to skin Intervention(s): Hand expression  Type of Nipple: Everted at rest and after stimulation  Comfort (Breast/Nipple): Soft / non-tender     Hold (Positioning): Assistance needed to correctly position infant at breast and maintain latch. Intervention(s): Breastfeeding basics reviewed;Support Pillows;Skin to skin;Position options  LATCH Score: 8  Lactation Tools Discussed/Used WIC Program: No   Consult Status Consult Status: Follow-up Date: 06/15/14 Follow-up type: In-patient    Merlene Laughter 06/14/2014, 11:00 PM

## 2014-06-15 MED ORDER — OXYCODONE-ACETAMINOPHEN 5-325 MG PO TABS: 1.0000 | ORAL_TABLET | ORAL | Status: DC | PRN

## 2014-06-15 MED ORDER — MAGNESIUM OXIDE 400 (241.3 MG) MG PO TABS: 200.0000 mg | ORAL_TABLET | Freq: Two times a day (BID) | ORAL | Status: DC

## 2014-06-15 MED ORDER — IBUPROFEN 600 MG PO TABS: 600.0000 mg | ORAL_TABLET | Freq: Four times a day (QID) | ORAL | Status: DC

## 2014-06-15 MED ORDER — FERROUS SULFATE 325 (65 FE) MG PO TABS: 325.0000 mg | ORAL_TABLET | Freq: Two times a day (BID) | ORAL | Status: DC

## 2014-06-15 NOTE — Lactation Note (Signed)
This note was copied from the chart of Lynn Laterra Lubinski. Lactation Consultation Note: Baby fussy when I went in room. Mom latched baby easily by herself. Able to hand express Colostrum. Mom reports she has pumped about 3 times and obtained a few drops each time. Has Medela pump for home. No questions at present. Reviewed OP appointments a resource after DC. To call prn  Patient Name: Lynn West KGMWN'U Date: 06/15/2014 Reason for consult: Follow-up assessment;Breast surgery   Maternal Data    Feeding Feeding Type: Breast Fed Length of feed: 5 min  LATCH Score/Interventions Latch: Grasps breast easily, tongue down, lips flanged, rhythmical sucking. Intervention(s): Skin to skin  Audible Swallowing: A few with stimulation Intervention(s): Skin to skin  Type of Nipple: Everted at rest and after stimulation (breast surgery- surgery scar around areola)  Comfort (Breast/Nipple): Soft / non-tender     Hold (Positioning): No assistance needed to correctly position infant at breast.  LATCH Score: 9  Lactation Tools Discussed/Used     Consult Status Consult Status: Complete    Truddie Crumble 06/15/2014, 11:36 AM

## 2014-06-15 NOTE — Lactation Note (Signed)
This note was copied from the chart of Lynn West. Lactation Consultation Note Baby had a 7% weight loss. Mom has hx; of breast surgery and we are monitoring for weight loss and colostrum expression. Mom doesn't want to supplement w/formula. Encouraged to stimulate breast and supplement w/colostrum. Watched mom latch baby in cross-cradle position. Baby sneezing frequently and nose is stuffy.BF well at breast. Cold in rm. Encouraged parents to lightly cover baby while BF, not to swaddle, have STS. FOB very supportive.  Patient Name: Lynn West AJOIN'O Date: 06/15/2014 Reason for consult: Follow-up assessment;Infant weight loss   Maternal Data    Feeding Feeding Type: Breast Fed Length of feed: 20 min  LATCH Score/Interventions Latch: Grasps breast easily, tongue down, lips flanged, rhythmical sucking. Intervention(s): Skin to skin;Teach feeding cues Intervention(s): Breast massage  Audible Swallowing: A few with stimulation Intervention(s): Skin to skin Intervention(s): Hand expression;Alternate breast massage  Type of Nipple: Everted at rest and after stimulation  Comfort (Breast/Nipple): Soft / non-tender     Hold (Positioning): No assistance needed to correctly position infant at breast. Intervention(s): Support Pillows;Position options;Skin to skin  LATCH Score: 9  Lactation Tools Discussed/Used Tools: Pump Breast pump type: Double-Electric Breast Pump WIC Program: No   Consult Status Consult Status: Follow-up Date: 06/15/14 Follow-up type: In-patient    Montine Hight, Elta Gu[place] 06/15/2014, 1:08 AM

## 2014-06-15 NOTE — Discharge Instructions (Signed)
TAKE prenatal for next year - call office if you need refills. Take IRON - MAGNESIUM - COLACE for next 6 weeks TWICE DAILY. May Take additional MAGNESIUM and COLACE if any constipation

## 2014-06-15 NOTE — Progress Notes (Signed)
PPD 2 SVD  S:  Reports feeling well - tired from clustering all night             Tolerating po/ No nausea or vomiting             Bleeding is light             Pain controlled with motrin             Up ad lib / ambulatory / voiding QS  Newborn breast feeding  / Circumcision done O:               VS: BP 104/58 mmHg  Pulse 88  Temp(Src) 98.5 F (36.9 C) (Oral)  Resp 18  Ht 5\' 4"  (1.626 m)  Wt 80.74 kg (178 lb)  BMI 30.54 kg/m2  SpO2 100%  LMP 09/21/2013  Breastfeeding? Unknown   LABS:              Recent Labs  06/14/14 0546 06/14/14 1717  WBC 18.6* 19.9*  HGB 8.7* 8.9*  PLT 114* 142*               Blood type: --/--/A POS, A POS (04/05 0925)  Rubella:      Immune                           Physical Exam:             Alert and oriented X3  Abdomen: soft, non-tender, non-distended              Fundus: firm, non-tender, Ueven  Perineum: no edema  Lochia: light  Extremities: trace edema, no calf pain or tenderness    A: PPD # 2              ABL anemia - stable             ITP - stable platelet  Doing well - stable status  P: Routine post partum orders  DC home - WOB booklet - instructions reviewed              Iron and magnesium x 6 weeks  Artelia Laroche CNM, MSN, [facility] 06/15/2014, 9:37 AM

## 2014-06-15 NOTE — Discharge Summary (Signed)
Obstetric Discharge Summary  Reason for Admission: onset of labor Prenatal Procedures: none Intrapartum Procedures: spontaneous vaginal delivery Postpartum Procedures: none Complications-Operative and Postpartum: 2nd degree perineal laceration and right sulcus / precipitous delivery HEMOGLOBIN  Date Value Ref Range Status  06/14/2014 8.9* 12.0 - 15.0 g/dL Final   HCT  Date Value Ref Range Status  06/14/2014 25.9* 36.0 - 46.0 % Final    Physical Exam:  General: alert, cooperative and no distress Lochia: appropriate Uterine Fundus: soft Incision: healing well DVT Evaluation: No evidence of DVT seen on physical exam.  Discharge Diagnoses: Term Pregnancy-delivered  Discharge Information: Date: 06/15/2014 Activity: pelvic rest Diet: routine Medications: PNV, Ibuprofen, Colace, Iron, Percocet and Magneisum Condition: stable Instructions: refer to practice specific booklet Discharge to: home Follow-up Information    Follow up with MODY,VAISHALI R, MD In 6 weeks.   Specialty:  Obstetrics and Gynecology   Contact information:   [place] [address] [phone]       Newborn Data: Live born female  Birth Weight: 6 lb 8.8 oz (2970 g) APGAR: 9, 9  Home with mother.  Lynn West 06/15/2014, 9:43 AM

## 2014-06-19 ENCOUNTER — Ambulatory Visit (HOSPITAL_COMMUNITY)
Admission: RE | Admit: 2014-06-19 | Discharge: 2014-06-19 | Disposition: A | Discharge status: Home / Self Care | Payer: 59 | Source: Ambulatory Visit | Attending: Obstetrics & Gynecology | Admitting: Obstetrics & Gynecology

## 2014-06-19 NOTE — Lactation Note (Signed)
Lactation Consult  Mother's reason for visit:  Problems with breastfeeding Visit Type:  Feeding assessment Appointment Notes:  Possible low milk supply Consult:  Initial Lactation Consultant:  Ave Filter  ________________________________________________________________________   70 Name: Lynn West Date of Birth: 06/13/2016 Pediatrician: Maisie Fus Gender: female Gestational Age: [redacted]w[redacted]d (At Birth) Birth Weight: 6 lb 8.8 oz (2970 g) Weight at Discharge: Weight: 6 lb 1.9 oz (2775 g)Date of Discharge: 06/15/2014 Filed Weights   06/13/14 9741 06/13/14 2324 06/14/14 2314  Weight: 6 lb 8.8 oz (2970 g) 6 lb 6.7 oz (2910 g) 6 lb 1.9 oz (2775 g)   Last weight taken from location outside of [facility]: 6-2 on 06/16/14 Location:Pediatrician's office Weight today: 6-6.7    ________________________________________________________________________  Mother's Name: Lynn West Type of delivery:   Vaginal Breastfeeding Experience:  First time breastfeeding Maternal Medical Conditions:  Bilateral mastopexy 2011; post partum anemia   ________________________________________________________________________  Breastfeeding History (Post Discharge)  Frequency of breastfeeding:  Every 2-3 hours Duration of feeding:  10-15 minutes  Supplementation  Formula:  Volume 63ml Frequency:  Every 3 hours        Brand: Enfamil and Similac  Breastmilk:  Volume 2-50ml Frequency: 3 times/day  Method:  Bottle,   Pumping  Type of pump:  Medela pump in style Frequency:  3-4 times per day Volume:  2-30ml  Infant Intake and Output Assessment  Voids:  6 in 24 hrs.  Color:  Clear yellow Stools:  3 in 24 hrs.  Color:  Brown and Yellow  ________________________________________________________________________  Maternal Breast Assessment  Breast:  Soft Nipple:  Erect Pain level:  0 Pain interventions:  Comfort  gels  _______________________________________________________________________ Feeding Assessment/Evaluation  Mom and 31 day old baby here for feeding assessment.  Mom is very emotional and teary eyed due to low milk supply.  She states breasts got full for one day but now soft.  The most she has pumped is 15 mls total.  Mom is very fatigued and stressed.  Baby is gaining well with formula supplementation.  Mom is only pumping 3-4 times per day.  She states baby is sleepy at breast and does a lot of non-nutritive sucking.  Mom has good breast tissue.  She does have a history of a breast mastopexy in 2011 but no breast tissue was removed.  Observed her latch baby easily to breast with good technique.  Baby mostly sleepy at breast with non-nutritive sucking.  Discussed goals for now will be to 1) increase rest and reduce stress  2) Increase milk supply  3) Feed baby for adequate weight gain.  Plan written and reviewed to breastfeed Lynn West with feeding cues using good massage during feedings.  Limit time at breast to 10-15 minutes on each side.  Pump both breasts after breastfeeding for 15-20 minutes.  Relax while pumping and use hands on pumping.  After 11 PM through the night mom will only pump and bottle feed to allow for more rest.  FOB is very supportive and will assist with night feedings.  Supplement baby with 50-60 mls of expressed milk/formula per bottle.  We discussed using a SNS but at this point I feel like it may be too stressful.  Hopefully this is a delay in lactogenesis and with increased pumping, more rest and less stress milk will come to volume.  Follow up appointment scheduled for Thursday 06/22/14.  Initial feeding assessment:  Infant's oral assessment:  WNL  Positioning:  Cross cradle Left breast  LATCH documentation:  Latch:  2 = Grasps breast easily, tongue down, lips flanged, rhythmical sucking.  Audible swallowing:  1 = A few with stimulation  Type of nipple:  2 = Everted at rest  and after stimulation  Comfort (Breast/Nipple):  2 = Soft / non-tender  Hold (Positioning):  2 = No assistance needed to correctly position infant at breast  LATCH score:  9  Attached assessment:  Deep  Lips flanged:  No.  Lips untucked:  Yes.    Suck assessment:  Nonnutritive    Pre-feed weight:  2912 g Post-feed weight:  2920 g  Amount transferred:  8 ml Amount supplemented:  20 ml    Total amount pumped post feed:  R 2 ml    L 6 ml

## 2014-06-22 ENCOUNTER — Ambulatory Visit: Payer: Self-pay

## 2014-06-22 ENCOUNTER — Ambulatory Visit (HOSPITAL_COMMUNITY)
Admission: RE | Admit: 2014-06-22 | Discharge: 2014-06-22 | Disposition: A | Discharge status: Home / Self Care | Payer: 59 | Source: Ambulatory Visit | Attending: Obstetrics & Gynecology | Admitting: Obstetrics & Gynecology

## 2014-06-22 NOTE — Lactation Note (Signed)
This note was copied from the chart of Lynn West. Lactation Consultation Note  Aysha is here today with Lynn to see how much he is transferring from the breast. Today he transferred 16 ml from the breast and 24 ml was given through an SNS. She reports that her milk is increasing in volume slowly.  Initially her breasts filled at 4 days but the supply quickly dwindled.  Her nipples have been lifted surgically but she is able to see milk exiting the pores. Today I observed Lynn suckling at the breast and it was shallow.  I applied an SNS and the increased volume increased the depth of his suckle but he was not able to create enough vacuum to transfer the milk independently.  The formula had to be pushed into the baby's mouth. He was then fed with a special needs feeder and it was noted that his lower jaw was quivering from muscle fatigue. Oral assessment: Does not flange upper lip, anterior bubble palate, snapback when sucking on a gloved finger,sucking blisters noted on lips.  Only the edges of the tongue elevate when crying. The posterior tongue does not elevate when crying. Manual elevation of the tongue reveals a posterior, submucosal frenum.  I suspect limited tongue mobility is affecting his suckle and therefore mom's milk supply. Parents were given names of experts in the field to do research on their own. Mom is growing weary of BF and bottle feeding followed by pumping.  We discussed her options. She will call for further assist as needed.  Patient Name: [name] ASUOR'V Date: 06/22/2014     Baby's Name: [name] Date of Birth: 06/13/2016 Pediatrician: Maisie Fus Gender: female Gestational Age: [redacted]w[redacted]d (At Birth) Birth Weight: 6 lb 8.8 oz (2970 g) Weight at Discharge: Weight: 6 lb 1.9 oz (2775 g)Date of Discharge: 06/15/2014 [facility] Weights   06/13/14 0943 06/13/14 2324 06/14/14 2314  Weight: 6 lb 8.8 oz (2970 g) 6 lb 6.7 oz (2910 g) 6 lb 1.9 oz (2775 g)     Weight today: 6 # 14oz                            Lactation Tools Discussed/Used   Special Needs Feeder to help strengthen oral muscles.  Consult Status  Follow-up per patient needs.    Van Clines 06/22/2014, 7:05 PM

## 2014-07-04 ENCOUNTER — Ambulatory Visit (HOSPITAL_COMMUNITY)
Admission: RE | Admit: 2014-07-04 | Discharge: 2014-07-04 | Disposition: A | Discharge status: Home / Self Care | Payer: 59 | Source: Ambulatory Visit | Attending: Obstetrics | Admitting: Obstetrics

## 2014-07-04 NOTE — Lactation Note (Signed)
Lactation Consult  Mother's reason for visit: Follow up visit for post revision Visit Type: Infant had frenectomy and upper lip lazer on April 21 Appointment Notes:  Mother states that she is attempting to feed infant once daily. She states that Dr Lynn West advised to do finger sweeps daily. She states that Lynn West attempts to feed for 15 mins but she is not observing any swallows. She states he becomes sleepy at the breast and has shallow sucks. Mother is continuing to pump at least 6 times daily. She is pumping 20-40 ml with each pumping. Consult:  Follow-Up Lactation Consultant:  Lynn West   Mother's Name: Lynn West Type of delivery:   Breastfeeding Experience:  none Maternal Medical Conditions:  mastopexy 2011, no history of infertility Maternal Medications: prenatal vits  ________________________________________________________________________  Breastfeeding History (Post Discharge)  Frequency of breastfeeding: once daily Duration of feeding: 15 mins  Supplementation  Formula:  Volume 90 ml Frequency: every 2-3 hours        Brand: Similac  Breastmilk:  Volume 20-40 ml per pumping  Method:  Bottle,   Pumping  Type of pump:  Medela pump in style Frequency:  6 times daily Volume:  20-40 ml  Infant Intake and Output Assessment  Voids:  10-12 in 24 hrs.  Color:  Clear yellow Stools:  1 in 24 hrs.  Color:    ________________________________________________________________________  Maternal Breast Assessment  Breast:  Soft Nipple:  Erect Pain level:  0 Pain interventions:  Bra  _______________________________________________________________________ Feeding Assessment/Evaluation:  Observed infant with a few nutritive and non-nutritive suckles on and off for 15-20 mins. Infant transferred 73ml. Observed a pinched ridge on the tip of mother's nipple when infant released the breast.  Infant latched on the alternate breast with shallow tugging.  Mother was  fit with a #20 nipple shield to bridge infant back to breast.  Infant latched to nipple shield readily. After attempts to flange lips. Infant transferred 12 ml.  Mother became teary several times during the consult. Mother states she only attempts to breastfeed when husband home.  Mother states that with pumping only she is finding that she has more time. She was overwhelmed with breastfeeding , bottle and then pumping.  Observed that there is slight scabbing over revision sites.  Mother has questions about the use of Reglan and Domperidone. She was referred to Dr Lynn West website    Infant's oral assessment:  Variance  Positioning:  Cross cradle Left breast  LATCH documentation:  Latch:  2 = Grasps breast easily, tongue down, lips flanged, rhythmical sucking.  Audible swallowing:  1 = A few with stimulation  Type of nipple:  2 = Everted at rest and after stimulation  Comfort (Breast/Nipple):  2 = Soft / non-tender  Hold (Positioning):  1 = Assistance needed to correctly position infant at breast and maintain latch  LATCH score:  8  Attached assessment:  Shallow  Lips flanged:  No.  Lips untucked:  Yes.    Suck assessment:  Nutritive and Nonnutritive  Pre-feed weight:  3618 g  (7 lb. 15.6oz.) Post-feed weight:  3626 g (7 lb. 15.9oz.) Amount transferred: 8 ml   Additional Feeding Assessment - #20 nipple shield was fit to bridge infant back to breast. Mother states that infant falls asleep after only a few mins of suckling.   Infant's oral assessment:  Variance  Positioning:  Cross cradle Right breast  LATCH documentation:  Latch:  1 = Repeated attempts needed to sustain latch,  nipple held in mouth throughout feeding, stimulation needed to elicit sucking reflex.  Audible swallowing:  2 = Spontaneous and intermittent  Type of nipple:  2 = Everted at rest and after stimulation  Comfort (Breast/Nipple):  2 = Soft / non-tender  Hold (Positioning):  1 = Assistance needed to  correctly position infant at breast and maintain latch  LATCH score:  8  Attached assessment:  Shallow  Lips flanged:  No.  Lips untucked:  Yes.    Suck assessment:  Nutritive and Nonnutritive  Tools:  Nipple shield 20 mm Instructed on use and cleaning of tool:  Yes.    Pre-feed weight:  3626 g   Post-feed weight:  3638 g  Amount transferred: 12 ml  Amount supplemented: 3 ounces, 25 ml of ebm and 65 ml of formula   Total amount transferred:  20 ml Total supplement given:  90 ml  Mother advised to continue to supplement infant with at least 3-5 ounces every 2-3 hours.  Discussed placing infant to the breast more frequently  Option of using the Nipple shield if needed Mother to continue to pump breast at least 6-8 times daily Mother aware of the  use of Domperidone and Reglan ( mother will read protocal on use of Domperidone if she decides to take) Dr Lynn West site [email or web address]) Mother advised to continue to do stretching exercises (handout given) from Dr Lynn West site Continue to take [place] Mother to follow up with Dr Lynn Fus on May 10.  Prn LC outpatient follow up, [facility]

## 2014-08-16 ENCOUNTER — Other Ambulatory Visit (INDEPENDENT_AMBULATORY_CARE_PROVIDER_SITE_OTHER): Payer: 59

## 2014-08-16 ENCOUNTER — Encounter: Payer: Self-pay | Admitting: Internal Medicine

## 2014-08-16 ENCOUNTER — Ambulatory Visit (INDEPENDENT_AMBULATORY_CARE_PROVIDER_SITE_OTHER): Payer: 59 | Admitting: Internal Medicine

## 2014-08-16 VITALS — BP 128/76 | HR 65 | Temp 98.2°F | Resp 12 | Ht 64.0 in | Wt 158.0 lb

## 2014-08-16 DIAGNOSIS — Z0000 Encounter for general adult medical examination without abnormal findings: Secondary | ICD-10-CM

## 2014-08-16 DIAGNOSIS — O99119 Other diseases of the blood and blood-forming organs and certain disorders involving the immune mechanism complicating pregnancy, unspecified trimester: Secondary | ICD-10-CM | POA: Diagnosis not present

## 2014-08-16 DIAGNOSIS — D696 Thrombocytopenia, unspecified: Secondary | ICD-10-CM

## 2014-08-16 DIAGNOSIS — D62 Acute posthemorrhagic anemia: Secondary | ICD-10-CM

## 2014-08-16 LAB — LIPID PANEL
CHOL/HDL RATIO: 3
Cholesterol: 167 mg/dL (ref 0–200)
HDL: 59.1 mg/dL (ref >39.00)
LDL Cholesterol: 97 mg/dL (ref 0–99)
NONHDL: 107.9
Triglycerides: 57 mg/dL (ref 0.0–149.0)
VLDL: 11.4 mg/dL (ref 0.0–40.0)

## 2014-08-16 LAB — CBC WITH DIFFERENTIAL/PLATELET
BASOS ABS: 0 10*3/uL (ref 0.0–0.1)
BASOS PCT: 0.3 % (ref 0.0–3.0)
Eosinophils Absolute: 0.1 10*3/uL (ref 0.0–0.7)
Eosinophils Relative: 1.3 % (ref 0.0–5.0)
HEMATOCRIT: 39.6 % (ref 36.0–46.0)
Hemoglobin: 13.3 g/dL (ref 12.0–15.0)
LYMPHS ABS: 2.4 10*3/uL (ref 0.7–4.0)
LYMPHS PCT: 39.1 % (ref 12.0–46.0)
MCHC: 33.5 g/dL (ref 30.0–36.0)
MCV: 88.8 fl (ref 78.0–100.0)
MONO ABS: 0.3 10*3/uL (ref 0.1–1.0)
Monocytes Relative: 4.9 % (ref 3.0–12.0)
NEUTROS PCT: 54.4 % (ref 43.0–77.0)
Neutro Abs: 3.3 10*3/uL (ref 1.4–7.7)
PLATELETS: 209 10*3/uL (ref 150.0–400.0)
RBC: 4.46 Mil/uL (ref 3.87–5.11)
RDW: 13.1 % (ref 11.5–15.5)
WBC: 6.2 10*3/uL (ref 4.0–10.5)

## 2014-08-16 LAB — BASIC METABOLIC PANEL
BUN: 19 mg/dL (ref 6–23)
CO2: 26 mEq/L (ref 19–32)
Calcium: 9.6 mg/dL (ref 8.4–10.5)
Chloride: 106 mEq/L (ref 96–112)
Creatinine, Ser: 0.78 mg/dL (ref 0.40–1.20)
GFR: 91.83 mL/min (ref >60.00)
Glucose, Bld: 91 mg/dL (ref 70–99)
Potassium: 4.5 mEq/L (ref 3.5–5.1)
Sodium: 138 mEq/L (ref 135–145)

## 2014-08-16 LAB — TSH: TSH: 0.53 u[IU]/mL (ref 0.35–4.50)

## 2014-08-16 NOTE — Progress Notes (Signed)
Pre visit review using our clinic review tool, if applicable. No additional management support is needed unless otherwise documented below in the visit note. 

## 2014-08-16 NOTE — Patient Instructions (Signed)
We will check on the blood today and call you back with the results.   Keep taking the multivitamin and work on getting back to the exercise when you are able.   Come back in 1-2 years for a physical or call us sooner if you have questions or problems.   Health Maintenance Adopting a healthy lifestyle and getting preventive care can go a long way to promote health and wellness. Talk with your health care provider about what schedule of regular examinations is right for you. This is a good chance for you to check in with your provider about disease prevention and staying healthy. In between checkups, there are plenty of things you can do on your own. Experts have done a lot of research about which lifestyle changes and preventive measures are most likely to keep you healthy. Ask your health care provider for more information. WEIGHT AND DIET  Eat a healthy diet  Be sure to include plenty of vegetables, fruits, low-fat dairy products, and lean protein.  Do not eat a lot of foods high in solid fats, added sugars, or salt.  Get regular exercise. This is one of the most important things you can do for your health.  Most adults should exercise for at least 150 minutes each week. The exercise should increase your heart rate and make you sweat (moderate-intensity exercise).  Most adults should also do strengthening exercises at least twice a week. This is in addition to the moderate-intensity exercise.  Maintain a healthy weight  Body mass index (BMI) is a measurement that can be used to identify possible weight problems. It estimates body fat based on height and weight. Your health care provider can help determine your BMI and help you achieve or maintain a healthy weight.  For females 46 years of age and older:   A BMI below 18.5 is considered underweight.  A BMI of 18.5 to 24.9 is normal.  A BMI of 25 to 29.9 is considered overweight.  A BMI of 30 and above is considered obese.  Watch  levels of cholesterol and blood lipids  You should start having your blood tested for lipids and cholesterol at 31 years of age, then have this test every 5 years.  You may need to have your cholesterol levels checked more often if:  Your lipid or cholesterol levels are high.  You are older than 31 years of age.  You are at high risk for heart disease.  CANCER SCREENING   Lung Cancer  Lung cancer screening is recommended for adults 71-33 years old who are at high risk for lung cancer because of a history of smoking.  A yearly low-dose CT scan of the lungs is recommended for people who:  Currently smoke.  Have quit within the past 15 years.  Have at least a 30-pack-year history of smoking. A pack year is smoking an average of one pack of cigarettes a day for 1 year.  Yearly screening should continue until it has been 15 years since you quit.  Yearly screening should stop if you develop a health problem that would prevent you from having lung cancer treatment.  Breast Cancer  Practice breast self-awareness. This means understanding how your breasts normally appear and feel.  It also means doing regular breast self-exams. Let your health care provider know about any changes, no matter how small.  If you are in your 20s or 30s, you should have a clinical breast exam (CBE) by a health care provider  every 1-3 years as part of a regular health exam.  If you are 33 or older, have a CBE every year. Also consider having a breast X-ray (mammogram) every year.  If you have a family history of breast cancer, talk to your health care provider about genetic screening.  If you are at high risk for breast cancer, talk to your health care provider about having an MRI and a mammogram every year.  Breast cancer gene (BRCA) assessment is recommended for women who have family members with BRCA-related cancers. BRCA-related cancers include:  Breast.  Ovarian.  Tubal.  Peritoneal  cancers.  Results of the assessment will determine the need for genetic counseling and BRCA1 and BRCA2 testing. Cervical Cancer Routine pelvic examinations to screen for cervical cancer are no longer recommended for nonpregnant women who are considered low risk for cancer of the pelvic organs (ovaries, uterus, and vagina) and who do not have symptoms. A pelvic examination may be necessary if you have symptoms including those associated with pelvic infections. Ask your health care provider if a screening pelvic exam is right for you.   The Pap test is the screening test for cervical cancer for women who are considered at risk.  If you had a hysterectomy for a problem that was not cancer or a condition that could lead to cancer, then you no longer need Pap tests.  If you are older than 65 years, and you have had normal Pap tests for the past 10 years, you no longer need to have Pap tests.  If you have had past treatment for cervical cancer or a condition that could lead to cancer, you need Pap tests and screening for cancer for at least 20 years after your treatment.  If you no longer get a Pap test, assess your risk factors if they change (such as having a new sexual partner). This can affect whether you should start being screened again.  Some women have medical problems that increase their chance of getting cervical cancer. If this is the case for you, your health care provider may recommend more frequent screening and Pap tests.  The human papillomavirus (HPV) test is another test that may be used for cervical cancer screening. The HPV test looks for the virus that can cause cell changes in the cervix. The cells collected during the Pap test can be tested for HPV.  The HPV test can be used to screen women 8 years of age and older. Getting tested for HPV can extend the interval between normal Pap tests from three to five years.  An HPV test also should be used to screen women of any age who  have unclear Pap test results.  After 31 years of age, women should have HPV testing as often as Pap tests.  Colorectal Cancer  This type of cancer can be detected and often prevented.  Routine colorectal cancer screening usually begins at 31 years of age and continues through 31 years of age.  Your health care provider may recommend screening at an earlier age if you have risk factors for colon cancer.  Your health care provider may also recommend using home test kits to check for hidden blood in the stool.  A small camera at the end of a tube can be used to examine your colon directly (sigmoidoscopy or colonoscopy). This is done to check for the earliest forms of colorectal cancer.  Routine screening usually begins at age 73.  Direct examination of the colon should  be repeated every 5-10 years through 31 years of age. However, you may need to be screened more often if early forms of precancerous polyps or small growths are found. Skin Cancer  Check your skin from head to toe regularly.  Tell your health care provider about any new moles or changes in moles, especially if there is a change in a mole's shape or color.  Also tell your health care provider if you have a mole that is larger than the size of a pencil eraser.  Always use sunscreen. Apply sunscreen liberally and repeatedly throughout the day.  Protect yourself by wearing long sleeves, pants, a wide-brimmed hat, and sunglasses whenever you are outside. HEART DISEASE, DIABETES, AND HIGH BLOOD PRESSURE   Have your blood pressure checked at least every 1-2 years. High blood pressure causes heart disease and increases the risk of stroke.  If you are between 28 years and 48 years old, ask your health care provider if you should take aspirin to prevent strokes.  Have regular diabetes screenings. This involves taking a blood sample to check your fasting blood sugar level.  If you are at a normal weight and have a low risk for  diabetes, have this test once every three years after 31 years of age.  If you are overweight and have a high risk for diabetes, consider being tested at a younger age or more often. PREVENTING INFECTION  Hepatitis B  If you have a higher risk for hepatitis B, you should be screened for this virus. You are considered at high risk for hepatitis B if:  You were born in a country where hepatitis B is common. Ask your health care provider which countries are considered high risk.  Your parents were born in a high-risk country, and you have not been immunized against hepatitis B (hepatitis B vaccine).  You have HIV or AIDS.  You use needles to inject street drugs.  You live with someone who has hepatitis B.  You have had sex with someone who has hepatitis B.  You get hemodialysis treatment.  You take certain medicines for conditions, including cancer, organ transplantation, and autoimmune conditions. Hepatitis C  Blood testing is recommended for:  Everyone born from [birth year] through 1967.  Anyone with known risk factors for hepatitis C. Sexually transmitted infections (STIs)  You should be screened for sexually transmitted infections (STIs) including gonorrhea and chlamydia if:  You are sexually active and are younger than 31 years of age.  You are older than 31 years of age and your health care provider tells you that you are at risk for this type of infection.  Your sexual activity has changed since you were last screened and you are at an increased risk for chlamydia or gonorrhea. Ask your health care provider if you are at risk.  If you do not have HIV, but are at risk, it may be recommended that you take a prescription medicine daily to prevent HIV infection. This is called pre-exposure prophylaxis (PrEP). You are considered at risk if:  You are sexually active and do not regularly use condoms or know the HIV status of your partner(s).  You take drugs by injection.  You are  sexually active with a partner who has HIV. Talk with your health care provider about whether you are at high risk of being infected with HIV. If you choose to begin PrEP, you should first be tested for HIV. You should then be tested every 3 months for as  long as you are taking PrEP.  PREGNANCY   If you are premenopausal and you may become pregnant, ask your health care provider about preconception counseling.  If you may become pregnant, take 400 to 800 micrograms (mcg) of folic acid every day.  If you want to prevent pregnancy, talk to your health care provider about birth control (contraception). OSTEOPOROSIS AND MENOPAUSE   Osteoporosis is a disease in which the bones lose minerals and strength with aging. This can result in serious bone fractures. Your risk for osteoporosis can be identified using a bone density scan.  If you are 69 years of age or older, or if you are at risk for osteoporosis and fractures, ask your health care provider if you should be screened.  Ask your health care provider whether you should take a calcium or vitamin D supplement to lower your risk for osteoporosis.  Menopause may have certain physical symptoms and risks.  Hormone replacement therapy may reduce some of these symptoms and risks. Talk to your health care provider about whether hormone replacement therapy is right for you.  HOME CARE INSTRUCTIONS   Schedule regular health, dental, and eye exams.  Stay current with your immunizations.   Do not use any tobacco products including cigarettes, chewing tobacco, or electronic cigarettes.  If you are pregnant, do not drink alcohol.  If you are breastfeeding, limit how much and how often you drink alcohol.  Limit alcohol intake to no more than 1 drink per day for nonpregnant women. One drink equals 12 ounces of beer, 5 ounces of wine, or 1 ounces of hard liquor.  Do not use street drugs.  Do not share needles.  Ask your health care provider for  help if you need support or information about quitting drugs.  Tell your health care provider if you often feel depressed.  Tell your health care provider if you have ever been abused or do not feel safe at home. Document Released: 09/09/2010 Document Revised: 07/11/2013 Document Reviewed: 01/26/2013 [facility] Patient Information 2015 [place], [place]. This information is not intended to replace advice given to you by your health care provider. Make sure you discuss any questions you have with your health care provider.

## 2014-08-18 ENCOUNTER — Encounter: Payer: Self-pay | Admitting: Internal Medicine

## 2014-08-18 DIAGNOSIS — Z0000 Encounter for general adult medical examination without abnormal findings: Secondary | ICD-10-CM | POA: Insufficient documentation

## 2014-08-18 NOTE — Assessment & Plan Note (Signed)
Recheck CBC with diff. She is not taking iron anymore as she did not refill the rx.

## 2014-08-18 NOTE — Assessment & Plan Note (Signed)
Up to date on pap smear and immunizations. With new child and negative depression screen. Talked to her about sun safety. Non-smoker, exercises regularly. Screening labs done today.

## 2014-08-18 NOTE — Progress Notes (Signed)
   Subjective:    Patient ID: Lynn West, female    DOB: 10-24-1985, 29 y.o.   MRN: [id number]  HPI The patient is a 29 YO female coming in for wellness. No new complaints.   PMH, [facility], social history reviewed and updated.   Review of Systems  Constitutional: Negative.   HENT: Negative.   Eyes: Negative.   Respiratory: Negative for cough, shortness of breath and wheezing.   Cardiovascular: Negative for chest pain, palpitations and leg swelling.  Gastrointestinal: Negative.   Musculoskeletal: Negative.   Skin: Negative.   Neurological: Negative.   Psychiatric/Behavioral: Negative.       Objective:   Physical Exam  Constitutional: She is oriented to person, place, and time. She appears well-developed and well-nourished.  HENT:  Head: Normocephalic and atraumatic.  Eyes: EOM are normal.  Neck: Normal range of motion.  Cardiovascular: Normal rate and regular rhythm.   Pulmonary/Chest: Effort normal and breath sounds normal.  Abdominal: Soft. She exhibits no distension. There is no tenderness.  Musculoskeletal: She exhibits no edema.  Neurological: She is alert and oriented to person, place, and time. Coordination normal.  Skin: Skin is warm and dry.  Psychiatric: She has a normal mood and affect.   Filed Vitals:   08/16/14 1123  BP: 128/76  Pulse: 65  Temp: 98.2 F (36.8 C)  TempSrc: Oral  Resp: 12  Height: 5\' 4"  (1.626 m)  Weight: 158 lb (71.668 kg)  SpO2: 98%      Assessment & Plan:

## 2014-08-18 NOTE — Assessment & Plan Note (Signed)
Recheck CBC with diff today.

## 2014-09-12 ENCOUNTER — Encounter: Payer: 59 | Admitting: Internal Medicine

## 2014-10-30 ENCOUNTER — Telehealth: Payer: Self-pay | Admitting: Internal Medicine

## 2014-10-30 MED ORDER — BENZONATATE 200 MG PO CAPS: 200.0000 mg | ORAL_CAPSULE | Freq: Two times a day (BID) | ORAL | Status: DC | PRN

## 2014-10-30 NOTE — Addendum Note (Signed)
Addended by: Earnstine Regal on: 10/30/2014 04:21 PM   Modules accepted: Orders, Medications

## 2014-10-30 NOTE — Telephone Encounter (Signed)
Ok with me 

## 2014-10-30 NOTE — Telephone Encounter (Signed)
Notified Bernell md ok rx sent to [facility].Marland KitchenJohny Chess

## 2014-10-30 NOTE — Telephone Encounter (Signed)
Do you know her well enough to prescribe this?

## 2014-10-30 NOTE — Telephone Encounter (Signed)
Pt called in and wanted to know if someone would like her in some Tessalon for her cough that she has.  She has not been seen for it   Pharmacy is [facility]

## 2014-11-08 ENCOUNTER — Other Ambulatory Visit: Payer: Self-pay | Admitting: *Deleted

## 2014-11-08 MED ORDER — PANTOPRAZOLE SODIUM 40 MG PO TBEC: 40.0000 mg | DELAYED_RELEASE_TABLET | Freq: Every day | ORAL | Status: DC

## 2014-11-08 NOTE — Telephone Encounter (Signed)
Patient needs refills on pantoprazole. Per Nicoletta Ba, PA-C, patient may have refills of this. Rx sent to [facility].

## 2015-02-05 LAB — HM MAMMOGRAPHY

## 2015-02-13 ENCOUNTER — Encounter: Payer: Self-pay | Admitting: Internal Medicine

## 2015-04-10 DIAGNOSIS — N644 Mastodynia: Secondary | ICD-10-CM | POA: Diagnosis not present

## 2015-04-10 DIAGNOSIS — N898 Other specified noninflammatory disorders of vagina: Secondary | ICD-10-CM | POA: Diagnosis not present

## 2015-04-20 MED FILL — PANTOPRAZOLE SOD DR 40 MG T: 40 | 90 days supply | Qty: 90 | Fill #1

## 2015-05-03 MED FILL — DROSPIRENONE-EE 3-0.03 MG T: 3-0.03 | 84 days supply | Qty: 84 | Fill #0

## 2015-06-27 ENCOUNTER — Encounter: Payer: Self-pay | Admitting: Internal Medicine

## 2015-06-28 ENCOUNTER — Telehealth: Payer: Self-pay | Admitting: *Deleted

## 2015-06-28 MED ORDER — DOXYCYCLINE HYCLATE 200 MG PO TBEC: DELAYED_RELEASE_TABLET | ORAL | Status: DC

## 2015-06-28 NOTE — Telephone Encounter (Signed)
Rx sent 

## 2015-06-28 NOTE — Telephone Encounter (Signed)
-----   Message from Alfredia Ferguson, PA-C sent at 06/28/2015  1:31 PM EDT ----- Regarding: rx  Please send rx for doxycycline 200 mg x one dose- to [facility]

## 2015-07-31 MED FILL — DROSPIRENONE-EE 3-0.03 MG T: 3-0.03 | 84 days supply | Qty: 84 | Fill #1

## 2015-08-14 DIAGNOSIS — R921 Mammographic calcification found on diagnostic imaging of breast: Secondary | ICD-10-CM | POA: Diagnosis not present

## 2015-08-14 DIAGNOSIS — Z803 Family history of malignant neoplasm of breast: Secondary | ICD-10-CM | POA: Diagnosis not present

## 2015-09-10 ENCOUNTER — Encounter: Payer: 59 | Admitting: Internal Medicine

## 2015-09-14 ENCOUNTER — Ambulatory Visit (INDEPENDENT_AMBULATORY_CARE_PROVIDER_SITE_OTHER): Payer: 59 | Admitting: Internal Medicine

## 2015-09-14 ENCOUNTER — Other Ambulatory Visit (INDEPENDENT_AMBULATORY_CARE_PROVIDER_SITE_OTHER): Payer: 59

## 2015-09-14 ENCOUNTER — Encounter: Payer: Self-pay | Admitting: Internal Medicine

## 2015-09-14 VITALS — BP 120/80 | HR 81 | Temp 98.2°F | Resp 12 | Ht 64.0 in | Wt 157.0 lb

## 2015-09-14 DIAGNOSIS — E538 Deficiency of other specified B group vitamins: Secondary | ICD-10-CM

## 2015-09-14 DIAGNOSIS — Z0000 Encounter for general adult medical examination without abnormal findings: Secondary | ICD-10-CM

## 2015-09-14 DIAGNOSIS — K219 Gastro-esophageal reflux disease without esophagitis: Secondary | ICD-10-CM | POA: Insufficient documentation

## 2015-09-14 LAB — COMPREHENSIVE METABOLIC PANEL
ALBUMIN: 3.9 g/dL (ref 3.5–5.2)
ALT: 14 U/L (ref 0–35)
AST: 15 U/L (ref 0–37)
Alkaline Phosphatase: 44 U/L (ref 39–117)
BILIRUBIN TOTAL: 0.5 mg/dL (ref 0.2–1.2)
BUN: 12 mg/dL (ref 6–23)
CALCIUM: 9.2 mg/dL (ref 8.4–10.5)
CO2: 25 meq/L (ref 19–32)
Chloride: 105 mEq/L (ref 96–112)
Creatinine, Ser: 0.69 mg/dL (ref 0.40–1.20)
GFR: 105.05 mL/min (ref >60.00)
Glucose, Bld: 99 mg/dL (ref 70–99)
Potassium: 4.4 mEq/L (ref 3.5–5.1)
Sodium: 137 mEq/L (ref 135–145)
Total Protein: 6.8 g/dL (ref 6.0–8.3)

## 2015-09-14 LAB — LIPID PANEL
Cholesterol: 177 mg/dL (ref 0–200)
HDL: 51.4 mg/dL (ref >39.00)
LDL Cholesterol: 98 mg/dL (ref 0–99)
NONHDL: 125.92
Total CHOL/HDL Ratio: 3
Triglycerides: 138 mg/dL (ref 0.0–149.0)
VLDL: 27.6 mg/dL (ref 0.0–40.0)

## 2015-09-14 LAB — CBC
HCT: 36 % (ref 36.0–46.0)
Hemoglobin: 12.2 g/dL (ref 12.0–15.0)
MCHC: 34 g/dL (ref 30.0–36.0)
MCV: 83.8 fl (ref 78.0–100.0)
Platelets: 163 10*3/uL (ref 150.0–400.0)
RBC: 4.29 Mil/uL (ref 3.87–5.11)
RDW: 14.1 % (ref 11.5–15.5)
WBC: 6.6 10*3/uL (ref 4.0–10.5)

## 2015-09-14 LAB — TSH: TSH: 0.95 u[IU]/mL (ref 0.35–4.50)

## 2015-09-14 LAB — VITAMIN B12: Vitamin B-12: 198 pg/mL — ABNORMAL LOW (ref 211–911)

## 2015-09-14 NOTE — Assessment & Plan Note (Signed)
Pap smear and immunizations up to date. Sees derm yearly for skin check. Counseled about the dangers of distracted driving. Needs to start exercising. Given screening recommendations.

## 2015-09-14 NOTE — Progress Notes (Signed)
Pre visit review using our clinic review tool, if applicable. No additional management support is needed unless otherwise documented below in the visit note. 

## 2015-09-14 NOTE — Patient Instructions (Signed)
We will check the labs today and send the results on mychart.   Think about starting back to some exercise to help with energy and stress.   Health Maintenance, Female Adopting a healthy lifestyle and getting preventive care can go a long way to promote health and wellness. Talk with your health care provider about what schedule of regular examinations is right for you. This is a good chance for you to check in with your provider about disease prevention and staying healthy. In between checkups, there are plenty of things you can do on your own. Experts have done a lot of research about which lifestyle changes and preventive measures are most likely to keep you healthy. Ask your health care provider for more information. WEIGHT AND DIET  Eat a healthy diet  Be sure to include plenty of vegetables, fruits, low-fat dairy products, and lean protein.  Do not eat a lot of foods high in solid fats, added sugars, or salt.  Get regular exercise. This is one of the most important things you can do for your health.  Most adults should exercise for at least 150 minutes each week. The exercise should increase your heart rate and make you sweat (moderate-intensity exercise).  Most adults should also do strengthening exercises at least twice a week. This is in addition to the moderate-intensity exercise.  Maintain a healthy weight  Body mass index (BMI) is a measurement that can be used to identify possible weight problems. It estimates body fat based on height and weight. Your health care provider can help determine your BMI and help you achieve or maintain a healthy weight.  For females 6 years of age and older:   A BMI below 18.5 is considered underweight.  A BMI of 18.5 to 24.9 is normal.  A BMI of 25 to 29.9 is considered overweight.  A BMI of 30 and above is considered obese.  Watch levels of cholesterol and blood lipids  You should start having your blood tested for lipids and  cholesterol at 32 years of age, then have this test every 5 years.  You may need to have your cholesterol levels checked more often if:  Your lipid or cholesterol levels are high.  You are older than 32 years of age.  You are at high risk for heart disease.  CANCER SCREENING   Lung Cancer  Lung cancer screening is recommended for adults 10-9 years old who are at high risk for lung cancer because of a history of smoking.  A yearly low-dose CT scan of the lungs is recommended for people who:  Currently smoke.  Have quit within the past 15 years.  Have at least a 30-pack-year history of smoking. A pack year is smoking an average of one pack of cigarettes a day for 1 year.  Yearly screening should continue until it has been 15 years since you quit.  Yearly screening should stop if you develop a health problem that would prevent you from having lung cancer treatment.  Breast Cancer  Practice breast self-awareness. This means understanding how your breasts normally appear and feel.  It also means doing regular breast self-exams. Let your health care provider know about any changes, no matter how small.  If you are in your 20s or 30s, you should have a clinical breast exam (CBE) by a health care provider every 1-3 years as part of a regular health exam.  If you are 43 or older, have a CBE every year. Also consider  having a breast X-ray (mammogram) every year.  If you have a family history of breast cancer, talk to your health care provider about genetic screening.  If you are at high risk for breast cancer, talk to your health care provider about having an MRI and a mammogram every year.  Breast cancer gene (BRCA) assessment is recommended for women who have family members with BRCA-related cancers. BRCA-related cancers include:  Breast.  Ovarian.  Tubal.  Peritoneal cancers.  Results of the assessment will determine the need for genetic counseling and BRCA1 and BRCA2  testing. Cervical Cancer Your health care provider may recommend that you be screened regularly for cancer of the pelvic organs (ovaries, uterus, and vagina). This screening involves a pelvic examination, including checking for microscopic changes to the surface of your cervix (Pap test). You may be encouraged to have this screening done every 3 years, beginning at age 39.  For women ages 2-65, health care providers may recommend pelvic exams and Pap testing every 3 years, or they may recommend the Pap and pelvic exam, combined with testing for human papilloma virus (HPV), every 5 years. Some types of HPV increase your risk of cervical cancer. Testing for HPV may also be done on women of any age with unclear Pap test results.  Other health care providers may not recommend any screening for nonpregnant women who are considered low risk for pelvic cancer and who do not have symptoms. Ask your health care provider if a screening pelvic exam is right for you.  If you have had past treatment for cervical cancer or a condition that could lead to cancer, you need Pap tests and screening for cancer for at least 20 years after your treatment. If Pap tests have been discontinued, your risk factors (such as having a new sexual partner) need to be reassessed to determine if screening should resume. Some women have medical problems that increase the chance of getting cervical cancer. In these cases, your health care provider may recommend more frequent screening and Pap tests. Colorectal Cancer  This type of cancer can be detected and often prevented.  Routine colorectal cancer screening usually begins at 32 years of age and continues through 32 years of age.  Your health care provider may recommend screening at an earlier age if you have risk factors for colon cancer.  Your health care provider may also recommend using home test kits to check for hidden blood in the stool.  A small camera at the end of a  tube can be used to examine your colon directly (sigmoidoscopy or colonoscopy). This is done to check for the earliest forms of colorectal cancer.  Routine screening usually begins at age 73.  Direct examination of the colon should be repeated every 5-10 years through 32 years of age. However, you may need to be screened more often if early forms of precancerous polyps or small growths are found. Skin Cancer  Check your skin from head to toe regularly.  Tell your health care provider about any new moles or changes in moles, especially if there is a change in a mole's shape or color.  Also tell your health care provider if you have a mole that is larger than the size of a pencil eraser.  Always use sunscreen. Apply sunscreen liberally and repeatedly throughout the day.  Protect yourself by wearing long sleeves, pants, a wide-brimmed hat, and sunglasses whenever you are outside. HEART DISEASE, DIABETES, AND HIGH BLOOD PRESSURE   High blood  pressure causes heart disease and increases the risk of stroke. High blood pressure is more likely to develop in:  People who have blood pressure in the high end of the normal range (130-139/85-89 mm Hg).  People who are overweight or obese.  People who are African American.  If you are 48-67 years of age, have your blood pressure checked every 3-5 years. If you are 48 years of age or older, have your blood pressure checked every year. You should have your blood pressure measured twice--once when you are at a hospital or clinic, and once when you are not at a hospital or clinic. Record the average of the two measurements. To check your blood pressure when you are not at a hospital or clinic, you can use:  An automated blood pressure machine at a pharmacy.  A home blood pressure monitor.  If you are between 85 years and 8 years old, ask your health care provider if you should take aspirin to prevent strokes.  Have regular diabetes screenings. This  involves taking a blood sample to check your fasting blood sugar level.  If you are at a normal weight and have a low risk for diabetes, have this test once every three years after 32 years of age.  If you are overweight and have a high risk for diabetes, consider being tested at a younger age or more often. PREVENTING INFECTION  Hepatitis B  If you have a higher risk for hepatitis B, you should be screened for this virus. You are considered at high risk for hepatitis B if:  You were born in a country where hepatitis B is common. Ask your health care provider which countries are considered high risk.  Your parents were born in a high-risk country, and you have not been immunized against hepatitis B (hepatitis B vaccine).  You have HIV or AIDS.  You use needles to inject street drugs.  You live with someone who has hepatitis B.  You have had sex with someone who has hepatitis B.  You get hemodialysis treatment.  You take certain medicines for conditions, including cancer, organ transplantation, and autoimmune conditions. Hepatitis C  Blood testing is recommended for:  Everyone born from [birth year] through 1967.  Anyone with known risk factors for hepatitis C. Sexually transmitted infections (STIs)  You should be screened for sexually transmitted infections (STIs) including gonorrhea and chlamydia if:  You are sexually active and are younger than 32 years of age.  You are older than 32 years of age and your health care provider tells you that you are at risk for this type of infection.  Your sexual activity has changed since you were last screened and you are at an increased risk for chlamydia or gonorrhea. Ask your health care provider if you are at risk.  If you do not have HIV, but are at risk, it may be recommended that you take a prescription medicine daily to prevent HIV infection. This is called pre-exposure prophylaxis (PrEP). You are considered at risk if:  You are  sexually active and do not regularly use condoms or know the HIV status of your partner(s).  You take drugs by injection.  You are sexually active with a partner who has HIV. Talk with your health care provider about whether you are at high risk of being infected with HIV. If you choose to begin PrEP, you should first be tested for HIV. You should then be tested every 3 months for as long as  you are taking PrEP.  PREGNANCY   If you are premenopausal and you may become pregnant, ask your health care provider about preconception counseling.  If you may become pregnant, take 400 to 800 micrograms (mcg) of folic acid every day.  If you want to prevent pregnancy, talk to your health care provider about birth control (contraception). OSTEOPOROSIS AND MENOPAUSE   Osteoporosis is a disease in which the bones lose minerals and strength with aging. This can result in serious bone fractures. Your risk for osteoporosis can be identified using a bone density scan.  If you are 63 years of age or older, or if you are at risk for osteoporosis and fractures, ask your health care provider if you should be screened.  Ask your health care provider whether you should take a calcium or vitamin D supplement to lower your risk for osteoporosis.  Menopause may have certain physical symptoms and risks.  Hormone replacement therapy may reduce some of these symptoms and risks. Talk to your health care provider about whether hormone replacement therapy is right for you.  HOME CARE INSTRUCTIONS   Schedule regular health, dental, and eye exams.  Stay current with your immunizations.   Do not use any tobacco products including cigarettes, chewing tobacco, or electronic cigarettes.  If you are pregnant, do not drink alcohol.  If you are breastfeeding, limit how much and how often you drink alcohol.  Limit alcohol intake to no more than 1 drink per day for nonpregnant women. One drink equals 12 ounces of beer, 5  ounces of wine, or 1 ounces of hard liquor.  Do not use street drugs.  Do not share needles.  Ask your health care provider for help if you need support or information about quitting drugs.  Tell your health care provider if you often feel depressed.  Tell your health care provider if you have ever been abused or do not feel safe at home.   This information is not intended to replace advice given to you by your health care provider. Make sure you discuss any questions you have with your health care provider.   Document Released: 09/09/2010 Document Revised: 03/17/2014 Document Reviewed: 01/26/2013 Elsevier Interactive Patient Education [employer].

## 2015-09-14 NOTE — Assessment & Plan Note (Addendum)
Protonix prn and able to skip several in a row. Continue and mentioned risks versus harm of long term use.

## 2015-09-14 NOTE — Progress Notes (Signed)
   Subjective:    Patient ID: Lynn West, female    DOB: 14-Mar-1985, 30 y.o.   MRN: [id number]  HPI The patient is a 30 YO female coming in for wellness. No new concerns. Some decrease in concentration and tiredness during the day. Using protonix prn and can miss for awhile in a row without symptoms.   PMH, [facility], social history reviewed and updated.   Review of Systems  Constitutional: Negative.   HENT: Negative.   Eyes: Negative.   Respiratory: Negative for cough, shortness of breath and wheezing.   Cardiovascular: Negative for chest pain, palpitations and leg swelling.  Gastrointestinal: Negative.   Musculoskeletal: Negative.   Skin: Negative.   Neurological: Negative.   Psychiatric/Behavioral: Negative.       Objective:   Physical Exam  Constitutional: She is oriented to person, place, and time. She appears well-developed and well-nourished.  HENT:  Head: Normocephalic and atraumatic.  Eyes: EOM are normal.  Neck: Normal range of motion.  Cardiovascular: Normal rate and regular rhythm.   Pulmonary/Chest: Effort normal and breath sounds normal. No respiratory distress. She has no wheezes. She has no rales.  Abdominal: Soft. She exhibits no distension. There is no tenderness.  Musculoskeletal: She exhibits no edema.  Neurological: She is alert and oriented to person, place, and time. Coordination normal.  Skin: Skin is warm and dry.  Psychiatric: She has a normal mood and affect.   Filed Vitals:   09/14/15 0804  BP: 120/80  Pulse: 81  Temp: 98.2 F (36.8 C)  TempSrc: Oral  Resp: 12  Height: 5\' 4"  (1.626 m)  Weight: 157 lb (71.215 kg)  SpO2: 99%      Assessment & Plan:

## 2015-09-17 ENCOUNTER — Ambulatory Visit (INDEPENDENT_AMBULATORY_CARE_PROVIDER_SITE_OTHER): Payer: 59

## 2015-09-17 DIAGNOSIS — E538 Deficiency of other specified B group vitamins: Secondary | ICD-10-CM | POA: Diagnosis not present

## 2015-09-17 MED ORDER — CYANOCOBALAMIN 1000 MCG/ML IJ SOLN
1000.0000 ug | Freq: Once | INTRAMUSCULAR | Status: AC
  Administered 2015-09-17: 1000 ug via INTRAMUSCULAR

## 2015-09-18 LAB — ANTI-PARIETAL ANTIBODY: Parietal Cell Antibody-IgG: NEGATIVE

## 2015-10-02 ENCOUNTER — Ambulatory Visit (INDEPENDENT_AMBULATORY_CARE_PROVIDER_SITE_OTHER): Payer: 59 | Admitting: *Deleted

## 2015-10-02 DIAGNOSIS — E538 Deficiency of other specified B group vitamins: Secondary | ICD-10-CM

## 2015-10-02 MED ORDER — CYANOCOBALAMIN 1000 MCG/ML IJ SOLN
1000.0000 ug | INTRAMUSCULAR | Status: DC
  Administered 2015-10-02: 1000 ug via INTRAMUSCULAR

## 2015-10-04 ENCOUNTER — Telehealth: Payer: Self-pay | Admitting: *Deleted

## 2015-10-04 ENCOUNTER — Other Ambulatory Visit (INDEPENDENT_AMBULATORY_CARE_PROVIDER_SITE_OTHER): Payer: 59

## 2015-10-04 ENCOUNTER — Other Ambulatory Visit: Payer: Self-pay | Admitting: *Deleted

## 2015-10-04 DIAGNOSIS — R5383 Other fatigue: Secondary | ICD-10-CM | POA: Diagnosis not present

## 2015-10-04 DIAGNOSIS — E538 Deficiency of other specified B group vitamins: Secondary | ICD-10-CM | POA: Diagnosis not present

## 2015-10-04 LAB — IGA: IgA: 211 mg/dL (ref 68–378)

## 2015-10-04 LAB — VITAMIN D 25 HYDROXY (VIT D DEFICIENCY, FRACTURES): VITD: 38.83 ng/mL (ref 30.00–100.00)

## 2015-10-04 NOTE — Telephone Encounter (Signed)
Alfredia Ferguson, PA-C sent to Loralie Champagne, PA-C Cc: Hulan Saas, RN        Rollene Fare , can you put in order for Tejasvi Blatter To have Vit D level, TTG and IGA .Marland KitchenMarland Kitchen Thank you   Previous Messages      Orders in EPIC.

## 2015-10-05 LAB — TISSUE TRANSGLUTAMINASE, IGA: Tissue Transglutaminase Ab, IgA: 1 U/mL (ref <4)

## 2015-10-09 ENCOUNTER — Telehealth: Payer: Self-pay | Admitting: *Deleted

## 2015-10-09 MED ORDER — ONDANSETRON 4 MG PO TBDP
4.0000 mg | ORAL_TABLET | Freq: Four times a day (QID) | ORAL | 0 refills | Status: DC | PRN
Start: 2015-10-09 — End: 2018-04-18

## 2015-10-09 MED FILL — ONDANSETRON ODT 4 MG TABLET: 4 | 7 days supply | Qty: 30 | Fill #0

## 2015-10-09 NOTE — Telephone Encounter (Signed)
-----   Message from Alfredia Ferguson, PA-C sent at 10/09/2015  4:12 PM EDT ----- Zofran 4 mg  q 6 hours prn nausea #30/0

## 2015-10-10 MED FILL — PANTOPRAZOLE SOD DR 40 MG T: 40 | 90 days supply | Qty: 90 | Fill #2

## 2015-10-16 ENCOUNTER — Ambulatory Visit (INDEPENDENT_AMBULATORY_CARE_PROVIDER_SITE_OTHER): Payer: 59 | Admitting: Emergency Medicine

## 2015-10-16 DIAGNOSIS — E538 Deficiency of other specified B group vitamins: Secondary | ICD-10-CM | POA: Diagnosis not present

## 2015-10-16 MED ORDER — CYANOCOBALAMIN 1000 MCG/ML IJ SOLN: 1000.0000 ug | Freq: Once | INTRAMUSCULAR | 0 refills | Status: DC

## 2015-10-16 MED ORDER — CYANOCOBALAMIN 1000 MCG/ML IJ SOLN
1000.0000 ug | INTRAMUSCULAR | Status: DC
  Administered 2015-10-16: 1000 ug via INTRAMUSCULAR

## 2015-10-22 ENCOUNTER — Encounter: Payer: Self-pay | Admitting: Internal Medicine

## 2015-10-22 MED FILL — DROSPIRENONE-EE 3-0.03 MG T: 3-0.03 | 84 days supply | Qty: 84 | Fill #2

## 2015-10-23 DIAGNOSIS — D225 Melanocytic nevi of trunk: Secondary | ICD-10-CM | POA: Diagnosis not present

## 2015-10-23 DIAGNOSIS — L814 Other melanin hyperpigmentation: Secondary | ICD-10-CM | POA: Diagnosis not present

## 2015-10-23 DIAGNOSIS — L918 Other hypertrophic disorders of the skin: Secondary | ICD-10-CM | POA: Diagnosis not present

## 2015-11-02 ENCOUNTER — Ambulatory Visit (INDEPENDENT_AMBULATORY_CARE_PROVIDER_SITE_OTHER): Payer: 59 | Admitting: *Deleted

## 2015-11-02 DIAGNOSIS — E538 Deficiency of other specified B group vitamins: Secondary | ICD-10-CM | POA: Diagnosis not present

## 2015-11-02 MED ORDER — CYANOCOBALAMIN 1000 MCG/ML IJ SOLN
1000.0000 ug | Freq: Once | INTRAMUSCULAR | Status: AC
  Administered 2015-11-02: 1000 ug via INTRAMUSCULAR

## 2015-11-02 NOTE — Progress Notes (Signed)
Medical treatment/procedure(s) were performed by non-physician practitioner and as supervising physician I was immediately available for consultation/collaboration. I agree with above. Mikyah Alamo A Joash Tony, MD  

## 2015-12-17 ENCOUNTER — Ambulatory Visit (INDEPENDENT_AMBULATORY_CARE_PROVIDER_SITE_OTHER): Payer: 59

## 2015-12-17 ENCOUNTER — Encounter: Payer: Self-pay | Admitting: Internal Medicine

## 2015-12-17 ENCOUNTER — Telehealth: Payer: Self-pay

## 2015-12-17 DIAGNOSIS — E538 Deficiency of other specified B group vitamins: Secondary | ICD-10-CM | POA: Diagnosis not present

## 2015-12-17 MED ORDER — CYANOCOBALAMIN 1000 MCG/ML IJ SOLN
1000.0000 ug | Freq: Once | INTRAMUSCULAR | Status: AC
  Administered 2015-12-17: 1000 ug via INTRAMUSCULAR

## 2015-12-17 NOTE — Telephone Encounter (Signed)
In your office notes, looks like you may have wanted patient to recheck labs in appx 3 months after beginning injections---mid October would be appx 3 months---do you want patient to go for b12 labs now?---please advise, I will call patient back----thanks---routing to dr Sharlet Salina

## 2015-12-18 NOTE — Telephone Encounter (Signed)
Advised patient of dr crawford's note----she will get labs then

## 2015-12-18 NOTE — Telephone Encounter (Signed)
Placed order, she can come get done 1-2 days before next B12 shot.

## 2015-12-24 DIAGNOSIS — Z01419 Encounter for gynecological examination (general) (routine) without abnormal findings: Secondary | ICD-10-CM | POA: Diagnosis not present

## 2015-12-24 DIAGNOSIS — Z6827 Body mass index (BMI) 27.0-27.9, adult: Secondary | ICD-10-CM | POA: Diagnosis not present

## 2015-12-24 DIAGNOSIS — N898 Other specified noninflammatory disorders of vagina: Secondary | ICD-10-CM | POA: Diagnosis not present

## 2015-12-24 DIAGNOSIS — N760 Acute vaginitis: Secondary | ICD-10-CM | POA: Diagnosis not present

## 2015-12-24 MED FILL — TINIDAZOLE 500 MG TABLET: 500 | 5 days supply | Qty: 10 | Fill #0

## 2015-12-25 MED FILL — JULEBER 0.15-30 MG-MCG TABS: 0.15-30 | 84 days supply | Qty: 84 | Fill #0

## 2016-01-14 ENCOUNTER — Other Ambulatory Visit: Payer: Self-pay | Admitting: Physician Assistant

## 2016-01-14 ENCOUNTER — Other Ambulatory Visit (INDEPENDENT_AMBULATORY_CARE_PROVIDER_SITE_OTHER): Payer: 59

## 2016-01-14 DIAGNOSIS — E538 Deficiency of other specified B group vitamins: Secondary | ICD-10-CM | POA: Diagnosis not present

## 2016-01-14 LAB — VITAMIN B12: Vitamin B-12: 294 pg/mL (ref 211–911)

## 2016-01-14 MED FILL — PANTOPRAZOLE SOD DR 40 MG T: 40 | 90 days supply | Qty: 90 | Fill #0

## 2016-01-23 ENCOUNTER — Ambulatory Visit (INDEPENDENT_AMBULATORY_CARE_PROVIDER_SITE_OTHER): Payer: 59

## 2016-01-23 DIAGNOSIS — D519 Vitamin B12 deficiency anemia, unspecified: Secondary | ICD-10-CM | POA: Diagnosis not present

## 2016-01-23 MED ORDER — CYANOCOBALAMIN 1000 MCG/ML IJ SOLN
1000.0000 ug | Freq: Once | INTRAMUSCULAR | Status: AC
  Administered 2016-01-23: 1000 ug via INTRAMUSCULAR

## 2016-01-24 NOTE — Progress Notes (Signed)
Medical treatment/procedure(s) were performed by non-physician practitioner and as supervising physician I was immediately available for consultation/collaboration. I agree with above. Elizabeth A Crawford, MD  

## 2016-02-08 ENCOUNTER — Telehealth: Payer: Self-pay | Admitting: Internal Medicine

## 2016-02-08 NOTE — Telephone Encounter (Signed)
Patient Name: Lynn West  DOB: 14-Apr-1985    Initial Comment caller states she has chest pain and palpitations   Nurse Assessment  Nurse: Raphael Gibney, RN, Vanita Ingles Date/Time Eilene Ghazi Time): 02/08/2016 2:23:20 PM  Confirm and document reason for call. If symptomatic, describe symptoms. ---Caller states she has had heart palpitations and chest pain at times. Sometimes they occur together and sometimes separately. Chest pain lasts less than 5 min. No symptoms now. No SOB. chest pain is in the middle of her chest towards the left side. Has been having intermittent chest pain and heart palpitations for several weeks.  Does the patient have any new or worsening symptoms? ---Yes  Will a triage be completed? ---Yes  Related visit to physician within the last 2 weeks? ---No  Does the PT have any chronic conditions? (i.e. diabetes, asthma, etc.) ---No  Is the patient pregnant or possibly pregnant? (Ask all females between the ages of 58-55) ---No  Is this a behavioral health or substance abuse call? ---No     Guidelines    Guideline Title Affirmed Question Affirmed Notes  Chest Pain [1] Chest pain lasting <= 5 minutes AND [2] NO chest pain or cardiac symptoms now (Exceptions: pains lasting a few seconds)    Final Disposition User   See Physician within 24 Hours Stringer, RN, Vanita Ingles    Comments  pt states she is not sure about coming to the Saturday clinic. She would like call back for appt next week.   Referrals  GO TO FACILITY REFUSED   Disagree/Comply: Disagree  Disagree/Comply Reason: Disagree with instructions

## 2016-02-08 NOTE — Telephone Encounter (Signed)
Would recommend Saturday clinic if amenable.

## 2016-02-12 ENCOUNTER — Encounter: Payer: Self-pay | Admitting: Internal Medicine

## 2016-02-14 DIAGNOSIS — R921 Mammographic calcification found on diagnostic imaging of breast: Secondary | ICD-10-CM | POA: Diagnosis not present

## 2016-02-15 ENCOUNTER — Ambulatory Visit (INDEPENDENT_AMBULATORY_CARE_PROVIDER_SITE_OTHER): Payer: 59 | Admitting: Internal Medicine

## 2016-02-15 ENCOUNTER — Encounter: Payer: Self-pay | Admitting: Internal Medicine

## 2016-02-15 VITALS — BP 120/82 | HR 74 | Temp 98.2°F | Resp 16 | Ht 64.0 in | Wt 158.0 lb

## 2016-02-15 DIAGNOSIS — R002 Palpitations: Secondary | ICD-10-CM | POA: Insufficient documentation

## 2016-02-15 MED ORDER — BUPROPION HCL ER (XL) 150 MG PO TB24: 150.0000 mg | ORAL_TABLET | Freq: Every day | ORAL | 3 refills | Status: DC

## 2016-02-15 MED FILL — BUPROPION HCL XL 150 MG TAB: 150 | 90 days supply | Qty: 90 | Fill #0

## 2016-02-15 NOTE — Progress Notes (Signed)
   Subjective:    Patient ID: Lynn West, female    DOB: 10/25/1985, 30 y.o.   MRN: [id number]  HPI The patient is a 30 YO female coming in for palpitations. Going on for several months. Over the last several months she has also had some mild intermittent chest tightness. Center of her chest. Lasts less than 1 minute. Able to breathe deeply and clear it. Some more stress in her life with work and home. No increase in caffeine and still sleeping well. The palpitations comes and last briefly. Not with activity and exercising without pain or palpitations.   Review of Systems  Constitutional: Negative.   Respiratory: Positive for chest tightness. Negative for cough, shortness of breath and wheezing.   Cardiovascular: Positive for palpitations. Negative for chest pain and leg swelling.  Gastrointestinal: Negative.   Musculoskeletal: Negative.   Neurological: Negative.   Psychiatric/Behavioral: The patient is nervous/anxious.       Objective:   Physical Exam  Constitutional: She is oriented to person, place, and time. She appears well-developed and well-nourished.  HENT:  Head: Normocephalic and atraumatic.  Eyes: EOM are normal.  Cardiovascular: Normal rate and regular rhythm.   No murmur heard. Pulmonary/Chest: Effort normal. No respiratory distress. She has no wheezes. She has no rales. She exhibits no tenderness.  Abdominal: Soft. She exhibits no distension. There is no tenderness. There is no rebound.  Musculoskeletal: She exhibits no edema.  Neurological: She is alert and oriented to person, place, and time.  Skin: Skin is warm and dry.   Vitals:   02/15/16 1110  BP: 120/82  Pulse: 74  Resp: 16  Temp: 98.2 F (36.8 C)  TempSrc: Oral  SpO2: 98%  Weight: 158 lb (71.7 kg)  Height: 5\' 4"  (1.626 m)   EKG: Rate 70, axis normal intervals normal, sinus, no pvcs, no st or t wave changes. No old to compare.     Assessment & Plan:

## 2016-02-15 NOTE — Assessment & Plan Note (Signed)
EKG normal, no PVCs. She would like to try wellbutrin for stress (taken in the past without side effects) and if still having the symptoms will do holter monitoring.

## 2016-02-15 NOTE — Patient Instructions (Signed)
We have sent in the wellbutrin for you to try.   Call us back if you are still having the palpitations and we can do the holter monitor.

## 2016-03-19 ENCOUNTER — Ambulatory Visit (INDEPENDENT_AMBULATORY_CARE_PROVIDER_SITE_OTHER): Payer: 59

## 2016-03-19 DIAGNOSIS — E538 Deficiency of other specified B group vitamins: Secondary | ICD-10-CM

## 2016-03-19 MED ORDER — CYANOCOBALAMIN 1000 MCG/ML IJ SOLN
1000.0000 ug | Freq: Once | INTRAMUSCULAR | Status: AC
  Administered 2016-03-19: 1000 ug via INTRAMUSCULAR

## 2016-04-07 MED FILL — JULEBER 0.15-30 MG-MCG TABS: 0.15-30 | 84 days supply | Qty: 84 | Fill #1

## 2016-04-24 ENCOUNTER — Ambulatory Visit: Payer: 59

## 2016-04-24 MED FILL — PANTOPRAZOLE SOD DR 40 MG T: 40 | 90 days supply | Qty: 90 | Fill #1

## 2016-04-25 ENCOUNTER — Ambulatory Visit (INDEPENDENT_AMBULATORY_CARE_PROVIDER_SITE_OTHER): Payer: 59

## 2016-04-25 ENCOUNTER — Other Ambulatory Visit: Payer: Self-pay | Admitting: Internal Medicine

## 2016-04-25 ENCOUNTER — Other Ambulatory Visit (INDEPENDENT_AMBULATORY_CARE_PROVIDER_SITE_OTHER): Payer: 59

## 2016-04-25 ENCOUNTER — Telehealth: Payer: Self-pay

## 2016-04-25 DIAGNOSIS — E538 Deficiency of other specified B group vitamins: Secondary | ICD-10-CM | POA: Diagnosis not present

## 2016-04-25 LAB — VITAMIN B12: Vitamin B-12: 1159 pg/mL — ABNORMAL HIGH (ref 211–911)

## 2016-04-25 MED ORDER — CYANOCOBALAMIN 1000 MCG/ML IJ SOLN
1000.0000 ug | Freq: Once | INTRAMUSCULAR | Status: AC
  Administered 2016-04-25: 1000 ug via INTRAMUSCULAR

## 2016-04-25 NOTE — Progress Notes (Signed)
Medical screening examination/treatment/procedure(s) were performed by non-physician practitioner and as supervising physician I was immediately available for consultation/collaboration. I agree with above. Kesley Mullens A Clayton Bosserman, MD 

## 2016-04-25 NOTE — Telephone Encounter (Signed)
Patient coming in for b12 labs today (nurse visit)--per dr Sharlet Salina check b12 lab first and then can give injection, we will call patient back with b12 labs/instructions

## 2016-05-15 MED FILL — BUPROPION HCL XL 150 MG TAB: 150 | 90 days supply | Qty: 90 | Fill #1

## 2016-05-25 ENCOUNTER — Encounter: Payer: Self-pay | Admitting: Internal Medicine

## 2016-06-23 ENCOUNTER — Other Ambulatory Visit (INDEPENDENT_AMBULATORY_CARE_PROVIDER_SITE_OTHER): Payer: 59

## 2016-06-23 ENCOUNTER — Encounter: Payer: Self-pay | Admitting: Internal Medicine

## 2016-06-23 ENCOUNTER — Ambulatory Visit (INDEPENDENT_AMBULATORY_CARE_PROVIDER_SITE_OTHER): Payer: 59 | Admitting: Internal Medicine

## 2016-06-23 VITALS — BP 150/84 | HR 86 | Temp 98.2°F | Resp 12 | Ht 64.0 in | Wt 162.0 lb

## 2016-06-23 DIAGNOSIS — D519 Vitamin B12 deficiency anemia, unspecified: Secondary | ICD-10-CM | POA: Diagnosis not present

## 2016-06-23 DIAGNOSIS — Z0000 Encounter for general adult medical examination without abnormal findings: Secondary | ICD-10-CM

## 2016-06-23 DIAGNOSIS — G4452 New daily persistent headache (NDPH): Secondary | ICD-10-CM | POA: Diagnosis not present

## 2016-06-23 LAB — COMPREHENSIVE METABOLIC PANEL
ALBUMIN: 4 g/dL (ref 3.5–5.2)
ALK PHOS: 33 U/L — AB (ref 39–117)
ALT: 17 U/L (ref 0–35)
AST: 19 U/L (ref 0–37)
BILIRUBIN TOTAL: 0.4 mg/dL (ref 0.2–1.2)
BUN: 13 mg/dL (ref 6–23)
CO2: 22 mEq/L (ref 19–32)
Calcium: 9 mg/dL (ref 8.4–10.5)
Chloride: 105 mEq/L (ref 96–112)
Creatinine, Ser: 0.77 mg/dL (ref 0.40–1.20)
GFR: 92.1 mL/min (ref >60.00)
Glucose, Bld: 88 mg/dL (ref 70–99)
POTASSIUM: 3.8 meq/L (ref 3.5–5.1)
SODIUM: 136 meq/L (ref 135–145)
TOTAL PROTEIN: 6.6 g/dL (ref 6.0–8.3)

## 2016-06-23 LAB — LIPID PANEL
CHOLESTEROL: 172 mg/dL (ref 0–200)
HDL: 52.9 mg/dL (ref >39.00)
LDL Cholesterol: 98 mg/dL (ref 0–99)
NONHDL: 118.96
Total CHOL/HDL Ratio: 3
Triglycerides: 107 mg/dL (ref 0.0–149.0)
VLDL: 21.4 mg/dL (ref 0.0–40.0)

## 2016-06-23 LAB — TSH: TSH: 0.99 u[IU]/mL (ref 0.35–4.50)

## 2016-06-23 LAB — CBC
HEMATOCRIT: 36.5 % (ref 36.0–46.0)
HEMOGLOBIN: 12.2 g/dL (ref 12.0–15.0)
MCHC: 33.4 g/dL (ref 30.0–36.0)
MCV: 87 fl (ref 78.0–100.0)
PLATELETS: 193 10*3/uL (ref 150.0–400.0)
RBC: 4.2 Mil/uL (ref 3.87–5.11)
RDW: 13.8 % (ref 11.5–15.5)
WBC: 5.7 10*3/uL (ref 4.0–10.5)

## 2016-06-23 LAB — VITAMIN D 25 HYDROXY (VIT D DEFICIENCY, FRACTURES): VITD: 38.18 ng/mL (ref 30.00–100.00)

## 2016-06-23 LAB — HEMOGLOBIN A1C: Hgb A1c MFr Bld: 5.2 % (ref 4.6–6.5)

## 2016-06-23 LAB — VITAMIN B12: Vitamin B-12: 795 pg/mL (ref 211–911)

## 2016-06-23 NOTE — Progress Notes (Signed)
   Subjective:    Patient ID: Lynn West, female    DOB: 13-Jun-1985, 31 y.o.   MRN: [id number]  HPI The patient is a 31 YO female coming in for some strange sensations on the side of her head. They feel sharp and tingly. They are associated with new daily headaches for the last two weeks. She is able to take ibuprofen to help with the headache but the sensation will last all day. No significant allergies or sinus problems. No fevers or chills. No weight change, chest pains, SOB, abdominal pain. Some random episodes of nausea which she cannot link with any triggers. Recently switched from B12 injections to oral B12 pills. Takes daily for the last month and has not been missing doses. Denies diarrhea or constipation. Taking birth control daily and normal period within the last month. Not having worsening depression.   Review of Systems  Constitutional: Positive for fatigue. Negative for activity change, appetite change, chills, fever and unexpected weight change.  HENT: Negative for congestion, ear discharge, ear pain, postnasal drip, rhinorrhea, sinus pain, sinus pressure, sore throat, tinnitus and trouble swallowing.   Eyes: Negative.   Respiratory: Negative.   Cardiovascular: Negative.   Gastrointestinal: Positive for nausea. Negative for abdominal distention, abdominal pain, constipation and diarrhea.  Musculoskeletal: Negative.   Skin: Negative.   Neurological: Positive for numbness and headaches. Negative for dizziness, seizures, syncope, weakness and light-headedness.  Hematological: Negative.       Objective:   Physical Exam  Constitutional: She is oriented to person, place, and time. She appears well-developed and well-nourished.  HENT:  Head: Normocephalic and atraumatic.  Right Ear: External ear normal.  Left Ear: External ear normal.  Eyes: EOM are normal.  Neck: Normal range of motion. No JVD present.  Cardiovascular: Normal rate and regular rhythm.   No murmur  heard. Pulmonary/Chest: Effort normal and breath sounds normal. No respiratory distress. She has no wheezes.  Abdominal: Soft. Bowel sounds are normal. She exhibits no distension. There is no tenderness. There is no rebound.  Musculoskeletal: She exhibits no edema.  Lymphadenopathy:    She has no cervical adenopathy.  Neurological: She is alert and oriented to person, place, and time.  Skin: Skin is warm and dry.  Psychiatric: She has a normal mood and affect.   Vitals:   06/23/16 0759  BP: (!) 150/84  Pulse: 86  Resp: 12  Temp: 98.2 F (36.8 C)  TempSrc: Oral  SpO2: 98%  Weight: 162 lb (73.5 kg)  Height: 5\' 4"  (1.626 m)      Assessment & Plan:

## 2016-06-23 NOTE — Progress Notes (Signed)
Pre visit review using our clinic review tool, if applicable. No additional management support is needed unless otherwise documented below in the visit note. 

## 2016-06-23 NOTE — Patient Instructions (Signed)
We will check the labs today and the MRI will be done.

## 2016-06-23 NOTE — Assessment & Plan Note (Signed)
Switched to oral B12 about 1 month ago and checking levels to see if new sensation symptoms are related to this change.

## 2016-06-23 NOTE — Assessment & Plan Note (Signed)
No history of headaches or migraines. New onset in the last 2 weeks with neurological symptoms. Will check MRI brain to exclude changes. Checking labs to rule out metabolic etiology or changes.

## 2016-06-30 ENCOUNTER — Encounter (HOSPITAL_COMMUNITY): Payer: Self-pay | Admitting: Radiology

## 2016-06-30 ENCOUNTER — Encounter: Payer: Self-pay | Admitting: Internal Medicine

## 2016-06-30 ENCOUNTER — Ambulatory Visit (HOSPITAL_COMMUNITY)
Admission: RE | Admit: 2016-06-30 | Discharge: 2016-06-30 | Disposition: A | Discharge status: Home / Self Care | Payer: 59 | Source: Ambulatory Visit | Attending: Internal Medicine | Admitting: Internal Medicine

## 2016-06-30 DIAGNOSIS — G4452 New daily persistent headache (NDPH): Secondary | ICD-10-CM | POA: Insufficient documentation

## 2016-06-30 DIAGNOSIS — R51 Headache: Secondary | ICD-10-CM | POA: Diagnosis not present

## 2016-06-30 DIAGNOSIS — J320 Chronic maxillary sinusitis: Secondary | ICD-10-CM | POA: Diagnosis not present

## 2016-07-13 ENCOUNTER — Encounter: Payer: Self-pay | Admitting: Internal Medicine

## 2016-07-14 MED ORDER — BUPROPION HCL ER (XL) 300 MG PO TB24: 300.0000 mg | ORAL_TABLET | Freq: Every day | ORAL | 1 refills | Status: DC

## 2016-07-14 MED FILL — BUPROPION HCL XL 300 MG TAB: 300 | 90 days supply | Qty: 90 | Fill #0

## 2016-07-15 MED FILL — APRI 0.15MG/0.03MG 28 DAY: 0.15-30 | 84 days supply | Qty: 84 | Fill #2

## 2016-07-21 MED FILL — PANTOPRAZOLE SOD DR 40 MG T: 40 | 90 days supply | Qty: 90 | Fill #2

## 2016-07-28 ENCOUNTER — Other Ambulatory Visit: Payer: Self-pay | Admitting: *Deleted

## 2016-07-28 MED ORDER — POLYETHYLENE GLYCOL 3350 17 GM/SCOOP PO POWD: ORAL | 2 refills | Status: DC

## 2016-08-08 MED FILL — POLYETHYLENE GLYCOL 3350 PO: 30 days supply | Qty: 527 | Fill #0

## 2016-08-26 DIAGNOSIS — R921 Mammographic calcification found on diagnostic imaging of breast: Secondary | ICD-10-CM | POA: Diagnosis not present

## 2016-09-27 ENCOUNTER — Encounter: Payer: Self-pay | Admitting: Internal Medicine

## 2016-09-27 DIAGNOSIS — D519 Vitamin B12 deficiency anemia, unspecified: Secondary | ICD-10-CM

## 2016-09-27 DIAGNOSIS — E559 Vitamin D deficiency, unspecified: Secondary | ICD-10-CM

## 2016-09-29 DIAGNOSIS — E559 Vitamin D deficiency, unspecified: Secondary | ICD-10-CM | POA: Insufficient documentation

## 2016-09-29 NOTE — Telephone Encounter (Signed)
MD is out of office on FMLA pls advise on email...Johny Chess

## 2016-10-15 MED FILL — buPROPion HCL ER (XL) 300 M: 300 | 90 days supply | Qty: 90 | Fill #1

## 2016-10-15 MED FILL — APRI 0.15MG/0.03MG 28 DAY: 0.15-30 | 84 days supply | Qty: 84 | Fill #3

## 2016-10-15 MED FILL — PANTOPRAZOLE SOD DR 40 MG T: 40 | 90 days supply | Qty: 90 | Fill #3

## 2016-10-23 ENCOUNTER — Encounter: Payer: Self-pay | Admitting: Family

## 2016-10-23 ENCOUNTER — Other Ambulatory Visit (INDEPENDENT_AMBULATORY_CARE_PROVIDER_SITE_OTHER): Payer: 59

## 2016-10-23 DIAGNOSIS — D519 Vitamin B12 deficiency anemia, unspecified: Secondary | ICD-10-CM | POA: Diagnosis not present

## 2016-10-23 DIAGNOSIS — E559 Vitamin D deficiency, unspecified: Secondary | ICD-10-CM | POA: Diagnosis not present

## 2016-10-23 LAB — VITAMIN B12: VITAMIN B 12: 1242 pg/mL — AB (ref 211–911)

## 2016-10-23 LAB — VITAMIN D 25 HYDROXY (VIT D DEFICIENCY, FRACTURES): VITD: 42.64 ng/mL (ref 30.00–100.00)

## 2016-10-28 DIAGNOSIS — D485 Neoplasm of uncertain behavior of skin: Secondary | ICD-10-CM | POA: Diagnosis not present

## 2016-10-28 DIAGNOSIS — L918 Other hypertrophic disorders of the skin: Secondary | ICD-10-CM | POA: Diagnosis not present

## 2016-10-28 DIAGNOSIS — L814 Other melanin hyperpigmentation: Secondary | ICD-10-CM | POA: Diagnosis not present

## 2016-10-28 DIAGNOSIS — D225 Melanocytic nevi of trunk: Secondary | ICD-10-CM | POA: Diagnosis not present

## 2016-12-08 DIAGNOSIS — K219 Gastro-esophageal reflux disease without esophagitis: Secondary | ICD-10-CM | POA: Diagnosis not present

## 2016-12-08 DIAGNOSIS — F458 Other somatoform disorders: Secondary | ICD-10-CM | POA: Diagnosis not present

## 2016-12-26 MED FILL — POLYETHYLENE GLYCOL 3350 PO: 30 days supply | Qty: 527 | Fill #1

## 2016-12-29 DIAGNOSIS — Z6828 Body mass index (BMI) 28.0-28.9, adult: Secondary | ICD-10-CM | POA: Diagnosis not present

## 2016-12-29 DIAGNOSIS — Z01419 Encounter for gynecological examination (general) (routine) without abnormal findings: Secondary | ICD-10-CM | POA: Diagnosis not present

## 2016-12-30 MED FILL — APRI 0.15MG/0.03MG 28 DAY: 0.15-30 | 84 days supply | Qty: 84 | Fill #0

## 2017-01-16 ENCOUNTER — Other Ambulatory Visit: Payer: Self-pay | Admitting: Gastroenterology

## 2017-01-16 MED FILL — PANTOPRAZOLE SOD DR 40 MG T: 40 | 90 days supply | Qty: 90 | Fill #0

## 2017-01-16 MED FILL — BUPROPION HCL XL 150 MG TAB: 150 | 90 days supply | Qty: 90 | Fill #2

## 2017-02-10 DIAGNOSIS — H5213 Myopia, bilateral: Secondary | ICD-10-CM | POA: Diagnosis not present

## 2017-02-10 DIAGNOSIS — H52203 Unspecified astigmatism, bilateral: Secondary | ICD-10-CM | POA: Diagnosis not present

## 2017-02-10 DIAGNOSIS — R922 Inconclusive mammogram: Secondary | ICD-10-CM | POA: Diagnosis not present

## 2017-02-10 DIAGNOSIS — R921 Mammographic calcification found on diagnostic imaging of breast: Secondary | ICD-10-CM | POA: Diagnosis not present

## 2017-04-23 MED FILL — PANTOPRAZOLE SOD DR 40 MG T: 40 | 90 days supply | Qty: 90 | Fill #1

## 2017-06-17 DIAGNOSIS — N949 Unspecified condition associated with female genital organs and menstrual cycle: Secondary | ICD-10-CM | POA: Diagnosis not present

## 2017-06-17 DIAGNOSIS — N816 Rectocele: Secondary | ICD-10-CM | POA: Diagnosis not present

## 2017-08-06 ENCOUNTER — Other Ambulatory Visit: Payer: Self-pay | Admitting: *Deleted

## 2017-08-06 MED ORDER — RANITIDINE HCL 150 MG PO TABS: 150.0000 mg | ORAL_TABLET | Freq: Two times a day (BID) | ORAL | 3 refills | Status: DC

## 2017-08-06 MED FILL — raNITIdine HCL 150 MG TABS: 150 | 90 days supply | Qty: 180 | Fill #0

## 2017-08-06 NOTE — Progress Notes (Signed)
Med sent   ===View-only below this line===  ----- Message ----- From: Mauri Pole, MD Sent: 08/06/2017  12:11 PM To: Oda Kilts, CMA, Laban Emperor Intriago, PA-C  Agree with trying Zantac as needed .  Shirlean Mylar can you please send Rx for Zantac 150mg  BID PRN Thanks ----- Message ----- From: Loralie Champagne, PA-C Sent: 08/06/2017  11:45 AM To: Mauri Pole, MD  Hey! I recently ran out of my pantoprazole and did not get it refilled.  I wanted to see what you thought about me just switching to zantac 150 mg BID for now.  I have kind of switched back and forth between the two over the years because I do not want to be on the PPI all the time ongoingly.  If so can you have Finnley Larusso send a prescription for me?  Thank you,  Jess

## 2017-08-12 ENCOUNTER — Other Ambulatory Visit: Payer: Self-pay | Admitting: Physician Assistant

## 2017-08-12 DIAGNOSIS — R110 Nausea: Secondary | ICD-10-CM

## 2017-08-13 ENCOUNTER — Other Ambulatory Visit: Payer: Self-pay | Admitting: Physician Assistant

## 2017-08-13 ENCOUNTER — Other Ambulatory Visit (INDEPENDENT_AMBULATORY_CARE_PROVIDER_SITE_OTHER): Payer: 59

## 2017-08-13 DIAGNOSIS — N912 Amenorrhea, unspecified: Secondary | ICD-10-CM | POA: Diagnosis not present

## 2017-08-13 LAB — HCG, QUANTITATIVE, PREGNANCY: Quantitative HCG: 3.14 m[IU]/mL

## 2017-08-14 ENCOUNTER — Other Ambulatory Visit (INDEPENDENT_AMBULATORY_CARE_PROVIDER_SITE_OTHER): Payer: 59

## 2017-08-14 DIAGNOSIS — N912 Amenorrhea, unspecified: Secondary | ICD-10-CM

## 2017-08-14 LAB — HCG, QUANTITATIVE, PREGNANCY: Quantitative HCG: 20.71 m[IU]/mL

## 2017-08-20 ENCOUNTER — Encounter: Payer: 59 | Admitting: Internal Medicine

## 2017-08-24 ENCOUNTER — Encounter: Payer: 59 | Admitting: Internal Medicine

## 2017-09-16 DIAGNOSIS — O09299 Supervision of pregnancy with other poor reproductive or obstetric history, unspecified trimester: Secondary | ICD-10-CM | POA: Diagnosis not present

## 2017-09-16 DIAGNOSIS — Z3A08 8 weeks gestation of pregnancy: Secondary | ICD-10-CM | POA: Diagnosis not present

## 2017-09-29 DIAGNOSIS — Z118 Encounter for screening for other infectious and parasitic diseases: Secondary | ICD-10-CM | POA: Diagnosis not present

## 2017-09-29 DIAGNOSIS — Z3689 Encounter for other specified antenatal screening: Secondary | ICD-10-CM | POA: Diagnosis not present

## 2017-09-29 DIAGNOSIS — Z3481 Encounter for supervision of other normal pregnancy, first trimester: Secondary | ICD-10-CM | POA: Diagnosis not present

## 2017-09-29 LAB — OB RESULTS CONSOLE RUBELLA ANTIBODY, IGM: RUBELLA: IMMUNE

## 2017-09-29 LAB — OB RESULTS CONSOLE HIV ANTIBODY (ROUTINE TESTING): HIV: NONREACTIVE

## 2017-09-29 LAB — OB RESULTS CONSOLE HEPATITIS B SURFACE ANTIGEN: Hepatitis B Surface Ag: NEGATIVE

## 2017-09-29 LAB — OB RESULTS CONSOLE GC/CHLAMYDIA
CHLAMYDIA, DNA PROBE: NEGATIVE
Gonorrhea: NEGATIVE

## 2017-09-29 LAB — OB RESULTS CONSOLE RPR: RPR: NONREACTIVE

## 2017-09-29 LAB — HM PAP SMEAR

## 2017-09-29 LAB — OB RESULTS CONSOLE ABO/RH: RH Type: POSITIVE

## 2017-09-29 LAB — OB RESULTS CONSOLE ANTIBODY SCREEN: ANTIBODY SCREEN: NEGATIVE

## 2017-11-12 DIAGNOSIS — Z361 Encounter for antenatal screening for raised alphafetoprotein level: Secondary | ICD-10-CM | POA: Diagnosis not present

## 2017-12-02 ENCOUNTER — Other Ambulatory Visit: Payer: Self-pay | Admitting: *Deleted

## 2017-12-02 DIAGNOSIS — O358XX0 Maternal care for other (suspected) fetal abnormality and damage, not applicable or unspecified: Secondary | ICD-10-CM | POA: Diagnosis not present

## 2017-12-02 DIAGNOSIS — Z3A19 19 weeks gestation of pregnancy: Secondary | ICD-10-CM | POA: Diagnosis not present

## 2017-12-02 MED ORDER — FAMOTIDINE 20 MG PO TABS: 20.0000 mg | ORAL_TABLET | Freq: Two times a day (BID) | ORAL | 3 refills | Status: DC

## 2017-12-02 MED FILL — FAMOTIDINE 20 MG TABLET: 20 | 90 days supply | Qty: 180 | Fill #0

## 2017-12-02 NOTE — Progress Notes (Signed)
===  View-only below this line===  ----- Message ----- From: Mauri Pole, MD Sent: 12/02/2017  11:11 AM EDT To: Oda Kilts, CMA, Laban Emperor Held, PA-C  Yes agree with switching to Pepcid.  Shirlean Mylar can you please send a prescription thanks ----- Message ----- From: Ritta Slot Sent: 12/02/2017  10:38 AM EDT To: Oda Kilts, CMA, Mauri Pole, MD  Hey ladies!!  I am almost out of my zantac and with all the recent zantac stuff I was wondering if I could switch to pepcid 20 mg BID.  Shirlean Mylar, if ok with Margarette Asal can you send a prescription to [facility] outpatient for me?  Thank you,  Jess

## 2017-12-03 DIAGNOSIS — R102 Pelvic and perineal pain: Secondary | ICD-10-CM | POA: Diagnosis not present

## 2017-12-03 DIAGNOSIS — O26892 Other specified pregnancy related conditions, second trimester: Secondary | ICD-10-CM | POA: Diagnosis not present

## 2017-12-03 DIAGNOSIS — Z3A19 19 weeks gestation of pregnancy: Secondary | ICD-10-CM | POA: Diagnosis not present

## 2017-12-28 DIAGNOSIS — O43892 Other placental disorders, second trimester: Secondary | ICD-10-CM | POA: Diagnosis not present

## 2017-12-28 DIAGNOSIS — Z3A23 23 weeks gestation of pregnancy: Secondary | ICD-10-CM | POA: Diagnosis not present

## 2017-12-28 DIAGNOSIS — Z3689 Encounter for other specified antenatal screening: Secondary | ICD-10-CM | POA: Diagnosis not present

## 2017-12-28 DIAGNOSIS — O09899 Supervision of other high risk pregnancies, unspecified trimester: Secondary | ICD-10-CM | POA: Diagnosis not present

## 2018-01-25 DIAGNOSIS — Z23 Encounter for immunization: Secondary | ICD-10-CM | POA: Diagnosis not present

## 2018-02-02 ENCOUNTER — Other Ambulatory Visit: Payer: Self-pay | Admitting: *Deleted

## 2018-02-02 MED ORDER — FAMOTIDINE 40 MG PO TABS: 40.0000 mg | ORAL_TABLET | Freq: Two times a day (BID) | ORAL | 3 refills | Status: DC

## 2018-02-02 MED FILL — FAMOTIDINE 40 MG TABS: 40 | 90 days supply | Qty: 180 | Fill #0

## 2018-02-08 DIAGNOSIS — O358XX0 Maternal care for other (suspected) fetal abnormality and damage, not applicable or unspecified: Secondary | ICD-10-CM | POA: Diagnosis not present

## 2018-02-08 DIAGNOSIS — Z3A29 29 weeks gestation of pregnancy: Secondary | ICD-10-CM | POA: Diagnosis not present

## 2018-02-08 DIAGNOSIS — Z3689 Encounter for other specified antenatal screening: Secondary | ICD-10-CM | POA: Diagnosis not present

## 2018-03-04 ENCOUNTER — Telehealth: Payer: Self-pay | Admitting: *Deleted

## 2018-03-04 NOTE — Telephone Encounter (Signed)
LM for pt that we are returning her call to please give a call back or respond to MyChart message.  MyChart message sent.

## 2018-03-04 NOTE — Telephone Encounter (Addendum)
Pt left VM message stating she would like information of how to go about obtaining a free breast pump. Her baby is due in February and she is a [employer]. Call placed to [facility] - Lactation Consultant for assistance w/pt request.  She will call back.

## 2018-03-05 ENCOUNTER — Telehealth (HOSPITAL_COMMUNITY): Payer: Self-pay | Admitting: Lactation Services

## 2018-03-05 ENCOUNTER — Encounter: Payer: Self-pay | Admitting: Family Medicine

## 2018-03-05 ENCOUNTER — Ambulatory Visit (INDEPENDENT_AMBULATORY_CARE_PROVIDER_SITE_OTHER): Payer: 59 | Admitting: Family Medicine

## 2018-03-05 VITALS — BP 126/76 | HR 94 | Temp 97.6°F | Ht 64.0 in | Wt 191.0 lb

## 2018-03-05 DIAGNOSIS — R05 Cough: Secondary | ICD-10-CM | POA: Diagnosis not present

## 2018-03-05 DIAGNOSIS — R059 Cough, unspecified: Secondary | ICD-10-CM

## 2018-03-05 MED ORDER — ALBUTEROL SULFATE HFA 108 (90 BASE) MCG/ACT IN AERS: 2.0000 | INHALATION_SPRAY | Freq: Four times a day (QID) | RESPIRATORY_TRACT | 0 refills | Status: DC | PRN

## 2018-03-05 MED FILL — PROAIR HFA 90 MCG INHALER: 108 (90 BAS | 25 days supply | Qty: 9 | Fill #0

## 2018-03-05 NOTE — Patient Instructions (Signed)
Nice to meet you  Honey, lozenges, Vicks VapoRub, humidifier, can all help with a cough. Please try couple of puffs if you begin coughing. Please follow up with [place] if no improvement after 7-10 days  Happy Holidays

## 2018-03-05 NOTE — Telephone Encounter (Signed)
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 She has searched the [employer]. Mom was informed of pumps available at the hospital when she delivers vs what has to be ordered. Mom has a PIS at home that she used for 1 month with her son. She is planning to see if it still works and replace tubing if needed. Enc mom to change out the membranes if she plans to use. Spectra pumsp were also discussed with mom. Mom to call back with further questions/concerns as needed.

## 2018-03-05 NOTE — Progress Notes (Signed)
Lynn West - 32 y.o. female MRN [id number]  Date of birth: September 05, 1985  SUBJECTIVE:  Including CC & ROS.  No chief complaint on file.   Lynn West is a 32 y.o. female that is presenting with cough and congestion.  She feels her symptoms are intermittent.  Denies any reported fevers.  She is currently pregnant.  She does not feel like her symptoms are associated with reflux.  She is not had any production with the cough.   Review of Systems  Constitutional: Negative for fever.  HENT: Negative for congestion.   Respiratory: Positive for cough.   Cardiovascular: Negative for chest pain.    HISTORY: Past Medical, Surgical, Social, and Family History Reviewed & Updated per EMR.   Pertinent Historical Findings include:  Past Medical History:  Diagnosis Date  . Active labor at term 06/13/2014  . Anxiety   . GERD (gastroesophageal reflux disease)   . Gestational thrombocytopenia ([place]) 06/13/2014  . Headaches, cluster    stress related  . History of chicken pox    childhood  . 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 Postpartum care following vaginal delivery (4/5) 06/13/2014    Past Surgical History:  Procedure Laterality Date  . Colposcopy  2011   with egotheraphy. 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 HYSTEROSCOPY W/D&C N/A 04/29/2013   Procedure: DILATATION AND CURETTAGE /HYSTEROSCOPY WITH EXCISION OF SEPTUM;  Surgeon: Governor Specking, MD;  Location: [facility];  Service: Gynecology;  Laterality: N/A;  . MASTOPEXY  2011  . VAGINAL SEPTOPLASTY  2004  . WISDOM TOOTH EXTRACTION  2003    No Known Allergies  Family History  Problem Relation Age of Onset  . Arthritis Mother   . Hypertension Mother   . Arthritis Father   . Hyperlipidemia Father   . Hypertension Father   . Colon cancer Maternal Grandfather 33  . Breast cancer Paternal Grandmother 61  . Hyperlipidemia Maternal Grandmother   . Hypertension Maternal Grandmother   . Coronary artery disease  Maternal Uncle 20  . Hypothyroidism Mother      Social History   Socioeconomic History  . Marital status: Married    Spouse name: Not on file  . Number of children: 0  . Years of education: Not on file  . Highest education level: Not on file  Occupational History    Employer: [employer]  . Financial resource strain: Not on file  . Food insecurity:    Worry: Not on file    Inability: Not on file  . Transportation needs:    Medical: Not on file    Non-medical: Not on file  Tobacco Use  . Smoking status: Never Smoker  . Smokeless tobacco: Never Used  . 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 Alcohol use: Yes    Comment: social  . Drug use: No    Comment: [phone]  . Sexual activity: Not on file  Lifestyle  . Physical activity:    Days per week: Not on file    Minutes per session: Not on file  . Stress: Not on file  Relationships  . Social connections:    Talks on phone: Not on file    Gets together: Not on file    Attends religious service: Not on file    Active member of club or organization: Not on file  Attends meetings of clubs or organizations: Not on file    Relationship status: Not on file  . Intimate partner violence:    Fear of current or ex partner: Not on file    Emotionally abused: Not on file    Physically abused: Not on file    Forced sexual activity: Not on file  Other Topics Concern  . Not on file  Social History Narrative  . Not on file     PHYSICAL EXAM:  VS: BP 126/76   Pulse 94   Temp 97.6 F (36.4 C) (Oral)   Ht 5\' 4"  (1.626 m)   Wt 191 lb (86.6 kg)   SpO2 98%   BMI 32.79 kg/m  Physical Exam Gen: NAD, alert, cooperative with exam, well-appearing ENT: normal lips, normal nasal mucosa, tympanic membranes clear and intact bilaterally, normal oropharynx Eye: normal EOM, normal conjunctiva and lids CV:  no edema, +2 pedal pulses     Resp: no accessory muscle use, non-labored, clear to auscultation bilaterally Skin: no rashes, no areas of induration  Neuro: normal tone, normal sensation to touch Psych:  normal insight, alert and oriented MSK: Normal gait, normal strength     ASSESSMENT & PLAN:   Cough Possible be reactive airway.  Possible for reflux as she is pregnant. -Could try an inhaler.  Discussed that she can talk with her OB before using it. -Counseled on supportive care. -Give indications to follow-up and return.

## 2018-03-08 DIAGNOSIS — R05 Cough: Secondary | ICD-10-CM | POA: Insufficient documentation

## 2018-03-08 DIAGNOSIS — R059 Cough, unspecified: Secondary | ICD-10-CM | POA: Insufficient documentation

## 2018-03-08 NOTE — Assessment & Plan Note (Signed)
Possible be reactive airway.  Possible for reflux as she is pregnant. -Could try an inhaler.  Discussed that she can talk with her OB before using it. -Counseled on supportive care. -Give indications to follow-up and return.

## 2018-03-09 ENCOUNTER — Encounter: Payer: Self-pay | Admitting: Family Medicine

## 2018-03-09 MED ORDER — AZITHROMYCIN 250 MG PO TABS: ORAL_TABLET | ORAL | 0 refills | Status: DC

## 2018-03-09 MED FILL — FAMOTIDINE 20 MG TABLET: 20 | 90 days supply | Qty: 180 | Fill #1

## 2018-03-09 MED FILL — AZITHROMYCIN 250 MG TABLET: 250 | 5 days supply | Qty: 6 | Fill #0

## 2018-03-10 NOTE — L&D Delivery Note (Addendum)
Delivery Note At 1:43 PM a viable and healthy female was delivered via Vaginal, Spontaneous (Presentation:OA).  APGAR: 8, 9; weight  -pending  Cord:  with the following complications: loose nuchal .   Cord pH: N/A Anesthesia:  None Episiotomy: None Lacerations: 1st degree vaginal/perineal, repaired while awaiting placental separation Suture Repair: 3.0 vicryl rapide   Placenta status: Retained. Manual removal in L&D.  After 40 minutes of retained placenta, we reviewed option to try manual removal in L&D v/s going to OR. Stable pt, stable vitals, no active bleeding. Pt preferred to avoid going to OR and gave verbal consent to proceed in the room. She was having pain even with fundal massage and tugging on the cord.  Pitocin turned off. CRNA advised to come with nitroglycerine to assist with placental manual removal. 30 mg IV Toradol given. Then 100 mcg Fentanyl and repeat 100 mcg Fentanyl with 4 mg Zofran, 10 mg Reglan and 4 mg Decadron (in IV bag) due to h/o n/v with narcotic meds.  CRNA gave incremental doses of Nitroglycerine up to 1200 mcg while I continued fundal massage and assessed her for placenta separation. There was no active bleeding.  Now placental separation appreciated and I was able to grasp it from lower uterine segment area and remove it manually but intact. Pitocin started back and 800 mcg of Cytotec placed rectally. Bleeding was well controlled. Vital signs stable, no hypotension or tachycardia noted.  2 gm Cefoxitin to be given for infection prevention due to manual removal of placenta.   Est. Blood Loss (mL): 465  Mom to postpartum.  Baby to Couplet care / Skin to Skin.  [name] 04/18/2018, 3:18 PM

## 2018-03-11 DIAGNOSIS — Z862 Personal history of diseases of the blood and blood-forming organs and certain disorders involving the immune mechanism: Secondary | ICD-10-CM | POA: Diagnosis not present

## 2018-03-22 DIAGNOSIS — Z3685 Encounter for antenatal screening for Streptococcus B: Secondary | ICD-10-CM | POA: Diagnosis not present

## 2018-03-22 DIAGNOSIS — O358XX0 Maternal care for other (suspected) fetal abnormality and damage, not applicable or unspecified: Secondary | ICD-10-CM | POA: Diagnosis not present

## 2018-03-22 DIAGNOSIS — O09899 Supervision of other high risk pregnancies, unspecified trimester: Secondary | ICD-10-CM | POA: Diagnosis not present

## 2018-03-22 DIAGNOSIS — Z3A35 35 weeks gestation of pregnancy: Secondary | ICD-10-CM | POA: Diagnosis not present

## 2018-03-22 LAB — OB RESULTS CONSOLE GBS: STREP GROUP B AG: POSITIVE

## 2018-03-26 DIAGNOSIS — Z3400 Encounter for supervision of normal first pregnancy, unspecified trimester: Secondary | ICD-10-CM | POA: Diagnosis not present

## 2018-03-29 DIAGNOSIS — O133 Gestational [pregnancy-induced] hypertension without significant proteinuria, third trimester: Secondary | ICD-10-CM | POA: Diagnosis not present

## 2018-03-29 DIAGNOSIS — Z3A36 36 weeks gestation of pregnancy: Secondary | ICD-10-CM | POA: Diagnosis not present

## 2018-04-05 DIAGNOSIS — O133 Gestational [pregnancy-induced] hypertension without significant proteinuria, third trimester: Secondary | ICD-10-CM | POA: Diagnosis not present

## 2018-04-05 DIAGNOSIS — Z3A37 37 weeks gestation of pregnancy: Secondary | ICD-10-CM | POA: Diagnosis not present

## 2018-04-07 ENCOUNTER — Telehealth (HOSPITAL_COMMUNITY): Payer: Self-pay | Admitting: *Deleted

## 2018-04-07 ENCOUNTER — Encounter (HOSPITAL_COMMUNITY): Payer: Self-pay | Admitting: *Deleted

## 2018-04-07 NOTE — Telephone Encounter (Signed)
Preadmission screen  

## 2018-04-09 ENCOUNTER — Encounter (HOSPITAL_COMMUNITY): Payer: Self-pay | Admitting: *Deleted

## 2018-04-09 ENCOUNTER — Telehealth (HOSPITAL_COMMUNITY): Payer: Self-pay | Admitting: *Deleted

## 2018-04-09 NOTE — Telephone Encounter (Signed)
Preadmission screen  

## 2018-04-13 DIAGNOSIS — Z3A38 38 weeks gestation of pregnancy: Secondary | ICD-10-CM | POA: Diagnosis not present

## 2018-04-13 DIAGNOSIS — O133 Gestational [pregnancy-induced] hypertension without significant proteinuria, third trimester: Secondary | ICD-10-CM | POA: Diagnosis not present

## 2018-04-13 DIAGNOSIS — O09893 Supervision of other high risk pregnancies, third trimester: Secondary | ICD-10-CM | POA: Diagnosis not present

## 2018-04-16 ENCOUNTER — Other Ambulatory Visit: Payer: Self-pay | Admitting: Obstetrics & Gynecology

## 2018-04-18 ENCOUNTER — Inpatient Hospital Stay (HOSPITAL_COMMUNITY)
Admission: RE | Admit: 2018-04-18 | Discharge: 2018-04-19 | DRG: 807 | Disposition: A | Discharge status: Home / Self Care | Payer: 59 | Attending: Obstetrics & Gynecology | Admitting: Obstetrics & Gynecology

## 2018-04-18 ENCOUNTER — Inpatient Hospital Stay (HOSPITAL_COMMUNITY): Payer: 59 | Admitting: Anesthesiology

## 2018-04-18 ENCOUNTER — Encounter (HOSPITAL_COMMUNITY): Payer: Self-pay

## 2018-04-18 DIAGNOSIS — O4593 Premature separation of placenta, unspecified, third trimester: Secondary | ICD-10-CM | POA: Diagnosis present

## 2018-04-18 DIAGNOSIS — Z3A38 38 weeks gestation of pregnancy: Secondary | ICD-10-CM

## 2018-04-18 DIAGNOSIS — O99824 Streptococcus B carrier state complicating childbirth: Secondary | ICD-10-CM | POA: Diagnosis present

## 2018-04-18 DIAGNOSIS — O134 Gestational [pregnancy-induced] hypertension without significant proteinuria, complicating childbirth: Secondary | ICD-10-CM | POA: Diagnosis present

## 2018-04-18 DIAGNOSIS — D6959 Other secondary thrombocytopenia: Secondary | ICD-10-CM | POA: Diagnosis present

## 2018-04-18 DIAGNOSIS — O43813 Placental infarction, third trimester: Secondary | ICD-10-CM | POA: Diagnosis not present

## 2018-04-18 DIAGNOSIS — Z3490 Encounter for supervision of normal pregnancy, unspecified, unspecified trimester: Secondary | ICD-10-CM | POA: Diagnosis present

## 2018-04-18 DIAGNOSIS — O9912 Other diseases of the blood and blood-forming organs and certain disorders involving the immune mechanism complicating childbirth: Secondary | ICD-10-CM | POA: Diagnosis present

## 2018-04-18 DIAGNOSIS — R030 Elevated blood-pressure reading, without diagnosis of hypertension: Secondary | ICD-10-CM | POA: Diagnosis present

## 2018-04-18 LAB — CBC
HCT: 41.8 % (ref 36.0–46.0)
Hemoglobin: 14 g/dL (ref 12.0–15.0)
MCH: 30 pg (ref 26.0–34.0)
MCHC: 33.5 g/dL (ref 30.0–36.0)
MCV: 89.7 fL (ref 80.0–100.0)
Platelets: 156 10*3/uL (ref 150–400)
RBC: 4.66 MIL/uL (ref 3.87–5.11)
RDW: 14.7 % (ref 11.5–15.5)
WBC: 12.1 10*3/uL — ABNORMAL HIGH (ref 4.0–10.5)
nRBC: 0 % (ref 0.0–0.2)

## 2018-04-18 LAB — COMPREHENSIVE METABOLIC PANEL
ALT: 17 U/L (ref 0–44)
AST: 23 U/L (ref 15–41)
Albumin: 3.6 g/dL (ref 3.5–5.0)
Alkaline Phosphatase: 148 U/L — ABNORMAL HIGH (ref 38–126)
Anion gap: 9 (ref 5–15)
BUN: 13 mg/dL (ref 6–20)
CO2: 17 mmol/L — ABNORMAL LOW (ref 22–32)
Calcium: 9.3 mg/dL (ref 8.9–10.3)
Chloride: 106 mmol/L (ref 98–111)
Creatinine, Ser: 0.56 mg/dL (ref 0.44–1.00)
Glucose, Bld: 88 mg/dL (ref 70–99)
Potassium: 4.1 mmol/L (ref 3.5–5.1)
Sodium: 132 mmol/L — ABNORMAL LOW (ref 135–145)
Total Bilirubin: 0.7 mg/dL (ref 0.3–1.2)
Total Protein: 6.8 g/dL (ref 6.5–8.1)

## 2018-04-18 LAB — TYPE AND SCREEN
ABO/RH(D): A POS
Antibody Screen: NEGATIVE

## 2018-04-18 LAB — PROTEIN / CREATININE RATIO, URINE
CREATININE, URINE: 166 mg/dL
Protein Creatinine Ratio: 0.24 mg/mg{Cre} — ABNORMAL HIGH (ref 0.00–0.15)
Total Protein, Urine: 40 mg/dL

## 2018-04-18 LAB — URIC ACID: Uric Acid, Serum: 5.4 mg/dL (ref 2.5–7.1)

## 2018-04-18 MED ORDER — PHENYLEPHRINE 40 MCG/ML (10ML) SYRINGE FOR IV PUSH (FOR BLOOD PRESSURE SUPPORT)
80.0000 ug | PREFILLED_SYRINGE | INTRAVENOUS | Status: DC | PRN
  Filled 2018-04-18: qty 10

## 2018-04-18 MED ORDER — ALBUTEROL SULFATE (2.5 MG/3ML) 0.083% IN NEBU: 2.5000 mg | INHALATION_SOLUTION | Freq: Four times a day (QID) | RESPIRATORY_TRACT | Status: DC | PRN

## 2018-04-18 MED ORDER — METOCLOPRAMIDE HCL 5 MG/ML IJ SOLN
10.0000 mg | Freq: Once | INTRAMUSCULAR | Status: AC
  Administered 2018-04-18: 10 mg via INTRAVENOUS

## 2018-04-18 MED ORDER — SODIUM CHLORIDE 0.9 % IV SOLN
5.0000 10*6.[IU] | Freq: Once | INTRAVENOUS | Status: AC
  Administered 2018-04-18: 5 10*6.[IU] via INTRAVENOUS
  Filled 2018-04-18: qty 5

## 2018-04-18 MED ORDER — METHYLERGONOVINE MALEATE 0.2 MG/ML IJ SOLN
INTRAMUSCULAR | Status: AC
  Filled 2018-04-18: qty 1

## 2018-04-18 MED ORDER — SENNOSIDES-DOCUSATE SODIUM 8.6-50 MG PO TABS
2.0000 | ORAL_TABLET | ORAL | Status: DC
  Administered 2018-04-19: 2 via ORAL
  Filled 2018-04-18: qty 2

## 2018-04-18 MED ORDER — ACETAMINOPHEN 325 MG PO TABS: 650.0000 mg | ORAL_TABLET | ORAL | Status: DC | PRN

## 2018-04-18 MED ORDER — EPHEDRINE 5 MG/ML INJ
10.0000 mg | INTRAVENOUS | Status: DC | PRN
  Filled 2018-04-18: qty 2

## 2018-04-18 MED ORDER — FENTANYL 2.5 MCG/ML BUPIVACAINE 1/10 % EPIDURAL INFUSION (WH - ANES)
INTRAMUSCULAR | Status: AC
  Filled 2018-04-18: qty 100

## 2018-04-18 MED ORDER — METOCLOPRAMIDE HCL 5 MG/ML IJ SOLN
INTRAMUSCULAR | Status: AC
  Administered 2018-04-18: 10 mg via INTRAVENOUS
  Filled 2018-04-18: qty 2

## 2018-04-18 MED ORDER — COCONUT OIL OIL: 1.0000 "application " | TOPICAL_OIL | Status: DC | PRN

## 2018-04-18 MED ORDER — SOD CITRATE-CITRIC ACID 500-334 MG/5ML PO SOLN: 30.0000 mL | ORAL | Status: DC | PRN

## 2018-04-18 MED ORDER — BENZOCAINE-MENTHOL 20-0.5 % EX AERO: 1.0000 "application " | INHALATION_SPRAY | CUTANEOUS | Status: DC | PRN

## 2018-04-18 MED ORDER — KETOROLAC TROMETHAMINE 30 MG/ML IJ SOLN
INTRAMUSCULAR | Status: AC
  Administered 2018-04-18: 30 mg via INTRAVENOUS
  Filled 2018-04-18: qty 2

## 2018-04-18 MED ORDER — LACTATED RINGERS IV SOLN
500.0000 mL | Freq: Once | INTRAVENOUS | Status: AC
  Administered 2018-04-18: 500 mL via INTRAVENOUS

## 2018-04-18 MED ORDER — NITROGLYCERIN IN D5W 200-5 MCG/ML-% IV SOLN
INTRAVENOUS | Status: AC
  Filled 2018-04-18: qty 250

## 2018-04-18 MED ORDER — ONDANSETRON HCL 4 MG/2ML IJ SOLN
4.0000 mg | Freq: Four times a day (QID) | INTRAMUSCULAR | Status: DC | PRN
  Administered 2018-04-18: 4 mg via INTRAVENOUS
  Filled 2018-04-18: qty 2

## 2018-04-18 MED ORDER — LABETALOL HCL 100 MG PO TABS
100.0000 mg | ORAL_TABLET | Freq: Once | ORAL | Status: AC
  Administered 2018-04-18: 100 mg via ORAL
  Filled 2018-04-18: qty 1

## 2018-04-18 MED ORDER — PHENYLEPHRINE 40 MCG/ML (10ML) SYRINGE FOR IV PUSH (FOR BLOOD PRESSURE SUPPORT)
PREFILLED_SYRINGE | INTRAVENOUS | Status: AC
  Filled 2018-04-18: qty 20

## 2018-04-18 MED ORDER — FAMOTIDINE 20 MG PO TABS
40.0000 mg | ORAL_TABLET | Freq: Two times a day (BID) | ORAL | Status: DC
  Administered 2018-04-18 – 2018-04-19 (×2): 40 mg via ORAL
  Filled 2018-04-18 (×2): qty 2

## 2018-04-18 MED ORDER — LIDOCAINE HCL (PF) 1 % IJ SOLN
30.0000 mL | INTRAMUSCULAR | Status: DC | PRN
  Administered 2018-04-18: 30 mL via SUBCUTANEOUS
  Filled 2018-04-18: qty 30

## 2018-04-18 MED ORDER — MISOPROSTOL 200 MCG PO TABS
800.0000 ug | ORAL_TABLET | Freq: Once | ORAL | Status: AC
  Administered 2018-04-18: 800 ug via RECTAL

## 2018-04-18 MED ORDER — ONDANSETRON HCL 4 MG/2ML IJ SOLN: 4.0000 mg | INTRAMUSCULAR | Status: DC | PRN

## 2018-04-18 MED ORDER — DIPHENHYDRAMINE HCL 25 MG PO CAPS: 25.0000 mg | ORAL_CAPSULE | Freq: Four times a day (QID) | ORAL | Status: DC | PRN

## 2018-04-18 MED ORDER — DEXAMETHASONE SODIUM PHOSPHATE 4 MG/ML IJ SOLN
4.0000 mg | Freq: Once | INTRAMUSCULAR | Status: AC
  Administered 2018-04-18: 4 mg via INTRAVENOUS
  Filled 2018-04-18: qty 1

## 2018-04-18 MED ORDER — KETOROLAC TROMETHAMINE 30 MG/ML IJ SOLN
30.0000 mg | Freq: Once | INTRAMUSCULAR | Status: AC
  Administered 2018-04-18: 30 mg via INTRAVENOUS

## 2018-04-18 MED ORDER — SODIUM CHLORIDE 0.9 % IV SOLN
1.0000 g | Freq: Once | INTRAVENOUS | Status: AC
  Administered 2018-04-18: 1 g via INTRAVENOUS
  Filled 2018-04-18: qty 1

## 2018-04-18 MED ORDER — OXYTOCIN 40 UNITS IN NORMAL SALINE INFUSION - SIMPLE MED: 2.5000 [IU]/h | INTRAVENOUS | Status: DC

## 2018-04-18 MED ORDER — FENTANYL CITRATE (PF) 100 MCG/2ML IJ SOLN
100.0000 ug | Freq: Once | INTRAMUSCULAR | Status: AC
  Administered 2018-04-18: 100 ug via INTRAVENOUS

## 2018-04-18 MED ORDER — DIPHENHYDRAMINE HCL 50 MG/ML IJ SOLN: 12.5000 mg | INTRAMUSCULAR | Status: DC | PRN

## 2018-04-18 MED ORDER — FENTANYL CITRATE (PF) 100 MCG/2ML IJ SOLN
INTRAMUSCULAR | Status: AC
  Filled 2018-04-18: qty 2

## 2018-04-18 MED ORDER — OXYTOCIN BOLUS FROM INFUSION
500.0000 mL | Freq: Once | INTRAVENOUS | Status: AC
  Administered 2018-04-18: 500 mL via INTRAVENOUS

## 2018-04-18 MED ORDER — DIBUCAINE 1 % RE OINT: 1.0000 "application " | TOPICAL_OINTMENT | RECTAL | Status: DC | PRN

## 2018-04-18 MED ORDER — KETOROLAC TROMETHAMINE 30 MG/ML IJ SOLN: 60.0000 mg | Freq: Once | INTRAMUSCULAR | Status: DC

## 2018-04-18 MED ORDER — ONDANSETRON HCL 4 MG PO TABS: 4.0000 mg | ORAL_TABLET | ORAL | Status: DC | PRN

## 2018-04-18 MED ORDER — FENTANYL 2.5 MCG/ML BUPIVACAINE 1/10 % EPIDURAL INFUSION (WH - ANES): 14.0000 mL/h | INTRAMUSCULAR | Status: DC | PRN

## 2018-04-18 MED ORDER — WITCH HAZEL-GLYCERIN EX PADS: 1.0000 "application " | MEDICATED_PAD | CUTANEOUS | Status: DC | PRN

## 2018-04-18 MED ORDER — PRENATAL MULTIVITAMIN CH
1.0000 | ORAL_TABLET | Freq: Every day | ORAL | Status: DC
  Administered 2018-04-19: 1 via ORAL
  Filled 2018-04-18: qty 1

## 2018-04-18 MED ORDER — OXYCODONE HCL 5 MG PO TABS: 5.0000 mg | ORAL_TABLET | Freq: Four times a day (QID) | ORAL | Status: DC | PRN

## 2018-04-18 MED ORDER — FENTANYL CITRATE (PF) 100 MCG/2ML IJ SOLN
INTRAMUSCULAR | Status: AC
  Administered 2018-04-18: 100 ug via INTRAVENOUS
  Filled 2018-04-18: qty 2

## 2018-04-18 MED ORDER — IBUPROFEN 600 MG PO TABS
600.0000 mg | ORAL_TABLET | Freq: Four times a day (QID) | ORAL | Status: DC
  Administered 2018-04-18 – 2018-04-19 (×3): 600 mg via ORAL
  Filled 2018-04-18 (×3): qty 1

## 2018-04-18 MED ORDER — SIMETHICONE 80 MG PO CHEW: 80.0000 mg | CHEWABLE_TABLET | ORAL | Status: DC | PRN

## 2018-04-18 MED ORDER — LACTATED RINGERS IV SOLN
500.0000 mL | INTRAVENOUS | Status: DC | PRN
  Administered 2018-04-18: 1000 mL via INTRAVENOUS

## 2018-04-18 MED ORDER — LACTATED RINGERS IV SOLN
INTRAVENOUS | Status: DC
  Administered 2018-04-18 (×2): via INTRAVENOUS

## 2018-04-18 MED ORDER — TETANUS-DIPHTH-ACELL PERTUSSIS 5-2.5-18.5 LF-MCG/0.5 IM SUSP: 0.5000 mL | Freq: Once | INTRAMUSCULAR | Status: DC

## 2018-04-18 MED ORDER — MISOPROSTOL 200 MCG PO TABS
ORAL_TABLET | ORAL | Status: AC
  Filled 2018-04-18: qty 4

## 2018-04-18 MED ORDER — TERBUTALINE SULFATE 1 MG/ML IJ SOLN
0.2500 mg | Freq: Once | INTRAMUSCULAR | Status: DC | PRN
  Filled 2018-04-18: qty 1

## 2018-04-18 MED ORDER — OXYTOCIN 40 UNITS IN NORMAL SALINE INFUSION - SIMPLE MED
1.0000 m[IU]/min | INTRAVENOUS | Status: DC
  Administered 2018-04-18: 1 m[IU]/min via INTRAVENOUS
  Filled 2018-04-18: qty 1000

## 2018-04-18 MED ORDER — MISOPROSTOL 200 MCG PO TABS
ORAL_TABLET | ORAL | Status: AC
  Filled 2018-04-18: qty 1

## 2018-04-18 MED ORDER — PENICILLIN G 3 MILLION UNITS IVPB - SIMPLE MED
3.0000 10*6.[IU] | INTRAVENOUS | Status: DC
  Administered 2018-04-18: 3 10*6.[IU] via INTRAVENOUS
  Filled 2018-04-18 (×3): qty 100

## 2018-04-18 NOTE — Anesthesia Pain Management Evaluation Note (Signed)
  CRNA Pain Management Visit Note  Patient: Lynn West, 33 y.o., female  "Hello I am a member of the anesthesia team at [facility]. We have an anesthesia team available at all times to provide care throughout the hospital, including epidural management and anesthesia for C-section. I don't know your plan for the delivery whether it a natural birth, water birth, IV sedation, nitrous supplementation, doula or epidural, but we want to meet your pain goals."   1.Was your pain managed to your expectations on prior hospitalizations?   No, the patient states that her first labor went very fast (~4 hours) and that she was 10 cm when she arrived at the hospital for delivery.   2.What is your expectation for pain management during this hospitalization?     Epidural  3.How can we help you reach that goal? Place epidural in time for delivery/help her decide best time to get it.  Record the patient's initial score and the patient's pain goal.   Pain: 3  Pain Goal: 10 [facility] wants you to be able to say your pain was always managed very well.  Katherine Syme 04/18/2018

## 2018-04-18 NOTE — H&P (Signed)
Lynn West is a 33 y.o. female presenting for IOL for labile elevated BPs, single umbilical artery, she is 38.6 wks, dated by 1st trim sono, [facility] 04/26/18.   [facility] from 6 wks.  Single umbilical artery, normal fetal echo, normal kidneys on sono, normal growth sono q 4 wks in 3rd trim, NST reactive wkly from 36 wks.  Labile elevated BPs in 3rd trim, most in 130s/ high 80s, home BPs mostly 120s/ 80s.. Asymptomatic patient. [place] labs normal 03/29/18 with P/c ratio 0.2 but platelets 121  (h/o gestational thrombocytopenia with G1 as well) G1- Boy, term, rapid labor.  H/o Mullerian uterine anomaly, uterine septum resection 2015, h/o vag septum resection in 2004  OB History    Gravida  2   Para  1   Term  1   Preterm      AB      Living  1     SAB      TAB      Ectopic      Multiple  0   Live Births  1          Past Medical History:  Diagnosis Date  . Active labor at term 06/13/2014  . Anxiety   . GERD (gastroesophageal reflux disease)   . Gestational thrombocytopenia ([place]) 06/13/2014  . Headaches, cluster    stress related  . History of chicken pox    childhood  . 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 Postpartum care following vaginal delivery (4/5) 06/13/2014  . Vaginal Pap smear, abnormal    Past Surgical History:  Procedure Laterality Date  . Colposcopy  2011   with egotheraphy. 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 HYSTEROSCOPY W/D&C N/A 04/29/2013   Procedure: DILATATION AND CURETTAGE /HYSTEROSCOPY WITH EXCISION OF SEPTUM;  Surgeon: Governor Specking, MD;  Location: [facility];  Service: Gynecology;  Laterality: N/A;  . MASTOPEXY  2011  . VAGINAL SEPTOPLASTY  2004  . WISDOM TOOTH EXTRACTION  2003   Family History: family history includes Arthritis in her father and mother; Breast cancer (age of onset: 31) in her paternal grandmother; Colon cancer (age of onset: 36) in her maternal grandfather; Coronary artery disease (age of onset: 45)  in her maternal uncle; Diabetes in her maternal grandfather and paternal grandmother; Heart disease in her maternal grandmother and maternal uncle; Hyperlipidemia in her father and maternal grandmother; Hypertension in her father, maternal grandmother, and mother; Hypothyroidism in her mother. Social History:  reports that she has never smoked. She has never used smokeless tobacco. She reports current alcohol use. She reports that she does not use drugs.     Maternal Diabetes: No Genetic Screening: Normal Panorama and AFP1 Maternal Ultrasounds/Referrals: Abnormal: Single umbilical artery, normal growth q 4 wks in 3rd trim, nl kidneys and nl fetal echo Fetal Ultrasounds or other Referrals:  Fetal echo Maternal Substance Abuse:  No Significant Maternal Medications:  Meds include: Other: Pepcid Significant Maternal Lab Results:  Lab values include: Group B Strep positive Other Comments:  gestational thrombocytopenia, labile elevated BPs  ROS no HA/ vision changes/ swelling/ epig pain History   Blood pressure (!) 145/95, temperature 98.7 F (37.1 C), resp. rate 16, height 5\' 4"  (1.626 m), weight 89.9 kg. Exam Physical Exam  Physical exam:  A&O x 3, no acute distress. Pleasant HEENT neg, no thyromegaly Lungs CTA bilat CV RRR, S1S2 normal Abdo soft, non tender, non acute Extr no edema/ tenderness Pelvic per  RN 4-5 cm and soft Vx. Intact  FHT  130s + accels no decels mod variab- cat I Toco q 3-5 min  Prenatal labs: ABO, Rh: A/Positive/-- (07/23 0000) Antibody: Negative (07/23 0000) Rubella: Immune (07/23 0000) RPR: Nonreactive (07/23 0000)  HBsAg: Negative (07/23 0000)  HIV: Non-reactive (07/23 0000)  GBS: Positive (01/13 0000)   Assessment/Plan: 33 yo G2P1001, 38.6 wks, IOL for labile elevated BPs, single umbilical artery, hx of gestational thrombocytopenia, hx of rapid labor, GBS(+) PCN per protocol Low dose pitocin 1x1  PIH labs, I/O Epidural when ready Anticipate  SVD  Aliea Bobe R Teegan Guinther 04/18/2018, 8:40 AM

## 2018-04-18 NOTE — Lactation Note (Signed)
This note was copied from a baby's chart. Lactation Consultation Note  Patient Name: Lynn West HQION'G Date: 04/18/2018 Reason for consult: Initial assessment;Early term 37-38.6wks  P2 mom, baby is 38 hours old.  Breastfed her first child who is now 33 years old for about one month; had difficulty with supply and came to outpatient lactation consultation a few times. Her first child had a tongue tie release via laser that she thinks impacted her lack of breastfeeding success. Baby is currently sleeping soundly in mom's arms. Mom states infant just breastfed for about 15 min @ 1800. Mom denies any pain or discomfort with latching infant. Informed mom that she may experience cramping during breastfeeding and that it is a reassuring sign of hormone release. Encouraged mom to alternate breasts and positions each feeding. Encouraged mom to feed with feeding cues or at least every 3 hours. Reviewed expected output prior to milk production. Reviewed hand expression and breast massage prior to feeding. Mom given pamphlet with IP/OP lactation services and phone number, extra feeding sheet with output table at bottom, and list of available resources. Encouraged mom to call out with any difficulties this evening.  Maternal Data Has patient been taught Hand Expression?: Yes(reinforced) Does the patient have breastfeeding experience prior to this delivery?: Yes  Interventions Interventions: Breast feeding basics reviewed;Skin to skin;Breast massage;Hand express;Breast compression;Support pillows;Position options  Consult Status Consult Status: Follow-up Date: 04/19/18 Follow-up type: In-patient    Cranston Neighbor 04/18/2018, 7:06 PM

## 2018-04-18 NOTE — Anesthesia Pain Management Evaluation Note (Signed)
    Patient: Lynn West, 33 y.o., female  Dr. Benjie Karvonen requested assistance from the Department of Anesthesia for facilitating removal a  patient's placenta on the [facility], specifically requesting administration of nitroglycerine.  I arrived promptly with IV nitroglycerine  (NTG)  200 mcg/cc, and syringes of phenylephrine & ephedrine.  The patient was uncomfortable but was hesitant to receive narcotics due to a history of associated nausea so I suggested our protocol of Zofran 4 mg/ Decadron 4 mg / Reglan 10 mg and it was administered with 100 mcg of Fentanyl IV.   NTG was dosed in intervals 200- 600 mcg at a time with blood pressure and heart rate monitored. Several attempts were made to extract the placenta  and an additional 100 mcg of Fentanyl was administered along with additional doses of  NTG IV. The patient's blood pressure and heart rate remained stable throughout the entire procedure and no rescue medications (phenylephrine/ephedrine) were needed.   She remained awake and alert, requesting to have her baby skin-to skin. The infant was placed skin-to-skin with the mother and began to breast feed. A final dose 600 mcg of NTG was administered and the placenta was delivered.   Blood loss estimated by surgeon was within normal limits ~ 400 cc and she stated that her uterus quickly became firm after the removal of the placenta.  Note: I was actively dosing the patient and administering the drugs, telling nursing staff each drug and dose that I administered. It was not until after the procedure that I discovered the the staff did not record the doses of NTG administered. The approximate total dose of NTG was ~1900 mcg.  Arantza Darrington 04/18/2018

## 2018-04-18 NOTE — Anesthesia Preprocedure Evaluation (Deleted)
Anesthesia Evaluation  Patient identified by MRN, date of birth, ID band Patient awake    Reviewed: Allergy & Precautions, NPO status , Patient's Chart, lab work & pertinent test results  Airway Mallampati: I  TM Distance: >3 FB Neck ROM: Full    Dental no notable dental hx.    Pulmonary neg pulmonary ROS,    Pulmonary exam normal breath sounds clear to auscultation       Cardiovascular negative cardio ROS Normal cardiovascular exam Rhythm:Regular Rate:Normal     Neuro/Psych  Headaches, PSYCHIATRIC DISORDERS Anxiety    GI/Hepatic Neg liver ROS, GERD  Medicated,  Endo/Other  negative endocrine ROS  Renal/GU negative Renal ROS  negative genitourinary   Musculoskeletal negative musculoskeletal ROS (+)   Abdominal   Peds  Hematology negative hematology ROS (+)   Anesthesia Other Findings   Reproductive/Obstetrics (+) Pregnancy                             Anesthesia Physical Anesthesia Plan  ASA: II  Anesthesia Plan: Epidural   Post-op Pain Management:    Induction:   PONV Risk Score and Plan: Treatment may vary due to age or medical condition  Airway Management Planned: Natural Airway  Additional Equipment:   Intra-op Plan:   Post-operative Plan:   Informed Consent: I have reviewed the patients History and Physical, chart, labs and discussed the procedure including the risks, benefits and alternatives for the proposed anesthesia with the patient or authorized representative who has indicated his/her understanding and acceptance.       Plan Discussed with: Anesthesiologist  Anesthesia Plan Comments: (Patient delivered prior to epidural placement.  )       Anesthesia Quick Evaluation

## 2018-04-18 NOTE — Progress Notes (Signed)
Lynn West is a 33 y.o. G2P1001 at [redacted]w[redacted]d 1st trim sono, IOL for labile elevated BPs now in GHTN range, baby with SUA. Here for IOL, low dose pitocin since GBS(+), will increase as we give 2nd PCN dose at 12.30 pm. Pt was in early labor when presented.  Epidural not desired at this point, will assess, didn't get with 1st kid due to rapid progress   Objective: BP 138/90   Pulse 78   Temp 98.7 F (37.1 C)   Resp 18   Ht 5\' 4"  (1.626 m)   Wt 89.9 kg   BMI 34.00 kg/m   FHT:  FHR: 130s bpm, variability: moderate,  accelerations:  Present,  decelerations:  Absent UC:   regular, every 4 minutes, pitocin at 3 mu rate  SVE:   Dilation: 5 Effacement (%): 70 Station: -2 Exam by:: Lynn Saltsman, MD  Labs: Lab Results  Component Value Date   WBC 12.1 (H) 04/18/2018   HGB 14.0 04/18/2018   HCT 41.8 04/18/2018   MCV 89.7 04/18/2018   PLT 156 04/18/2018    Assessment / Plan: Augmentation of labor, progressing well, continue at low dose pitocin to allow GBS coverage. AROM after 2nd dose, anticipate SVD.   Preeclampsia:  None. Has GHTN with admission BPs, no symptoms and nl labs Fetal Wellbeing:  Category I Pain Control:  Labor support without medications options reviewed, may consider Nitrous  I/D:  1st PCN at 8,30 am Anticipated MOD:  NSVD  Lynn West 04/18/2018, 11:20 AM

## 2018-04-18 NOTE — Progress Notes (Signed)
Lynn West is a 33 y.o. G2P1001, GHTN.  2nd PCN hanging. UCs are still not very strong   Objective: BP (!) 149/90   Pulse 87   Temp (!) 97.4 F (36.3 C) (Oral)   Resp 16   Ht 5\' 4"  (1.626 m)   Wt 89.9 kg   BMI 34.00 kg/m   FHR: 130s bpm, variability: moderate,  accelerations:  Present,  decelerations:  Absent UC:   regular, every 4 minutes, pitocin at 3 mu rate  SVE:   Dilation: 6 Effacement (%): 70 Station: -2 Exam by:: Lynn Blucher MD AROM clear fluid, head well applied to cx after AROM but still -2.  High Fowler's position   Assessment / Plan: Augmentation of labor, progressing well , increase Pitocin 2x2 since 2nd PCN going GHTN with admission BPs, no symptoms and nl labs. Labetalol 100mg  PO now and assess if needs to continues PP Fetal Wellbeing:  Category I  I/D:  GBS+, 2nd PCN going  Anticipated MOD:  NSVD  Lynn West 04/18/2018, 1:08 PM

## 2018-04-18 NOTE — Lactation Note (Addendum)
This note was copied from a baby's chart. Lactation Consultation Note  Patient Name: Lynn West JOINO'M Date: 04/18/2018 Reason for consult: Follow-up assessment;Mother's request;Early term 37-38.6wks;Breast augmentation  Called back to room by parents who requested LC to observe feeding/latch. Parents state infant just fed x45 min but still giving feeding cues.  Wet diaper changed by FOB and baby burped by [facility] while mom in restroom.  After burping, infant settled down significantly but still somewhat alert. Mom wanted to try to latch while LC present. Infant noted to have extra piece of tissue on tip of tongue, pink in color; no interference with sucking reflex. Encouraged parents to ask pediatrician at next exam. Infant easily latched to left breast in football hold by mom with minimal assistance by [facility]. Mom with excellent technique. Reinforced prior teaching re: breast massage and hand expression prior to feeding.  Infant aggressive at first but then slowed to only suckle when stimulated.  Pillows used for support and instructed mom to have infant at the level of her breast during feeds. Reassured parents that infant may be satisfied after feeding x45 min previously. Reminded parents to burp infant after feeds. Mom noted to have incisional scar on breast and LC asked about hx of breast surgery. Mom states in 2012 she planned to have breast augmentation but then decided to only have a lift instead once procedure started. "T" incision noted on underside of breast and mom states nipple/areola was removed and subsequently replaced. Mom states she can elicit small drops of colostrum with hand expression. Mom does report issues with low supply with first child. Strongly encouraged mom to follow-up with outpatient lactation consultation after discharge. Mom states she has a new Spectra 2 pump at home, but also kept her Medela that she used with previous child. Reinforced to parents to feed with cues or  at least every 3 hours.  Maternal Data Has patient been taught Hand Expression?: Yes Does the patient have breastfeeding experience prior to this delivery?: Yes  Feeding Feeding Type: Breast Fed  LATCH Score Latch: Grasps breast easily, tongue down, lips flanged, rhythmical sucking.  Audible Swallowing: A few with stimulation  Type of Nipple: Everted at rest and after stimulation  Comfort (Breast/Nipple): Soft / non-tender  Hold (Positioning): Assistance needed to correctly position infant at breast and maintain latch.  LATCH Score: 8  Interventions Interventions: Breast feeding basics reviewed;Assisted with latch;Skin to skin;Breast massage;Hand express;Support pillows;Adjust position  Consult Status Consult Status: Follow-up Date: 04/19/18 Follow-up type: In-patient    Cranston Neighbor 04/18/2018, 9:22 PM

## 2018-04-19 LAB — RPR: RPR: NONREACTIVE

## 2018-04-19 LAB — CBC
HCT: 35 % — ABNORMAL LOW (ref 36.0–46.0)
HEMOGLOBIN: 11.4 g/dL — AB (ref 12.0–15.0)
MCH: 29 pg (ref 26.0–34.0)
MCHC: 32.6 g/dL (ref 30.0–36.0)
MCV: 89.1 fL (ref 80.0–100.0)
Platelets: 134 10*3/uL — ABNORMAL LOW (ref 150–400)
RBC: 3.93 MIL/uL (ref 3.87–5.11)
RDW: 14.7 % (ref 11.5–15.5)
WBC: 16 10*3/uL — ABNORMAL HIGH (ref 4.0–10.5)
nRBC: 0 % (ref 0.0–0.2)

## 2018-04-19 MED ORDER — IBUPROFEN 600 MG PO TABS: 600.0000 mg | ORAL_TABLET | Freq: Four times a day (QID) | ORAL | 0 refills | Status: DC

## 2018-04-19 MED FILL — IBUPROFEN 600 MG TABLET: 600 | 7 days supply | Qty: 30 | Fill #0

## 2018-04-19 NOTE — Progress Notes (Addendum)
PPD #1, SVD, 1st degree repair, baby girl "Lynn West"  S:  Reports feeling good, tired; had concerns for baby "choking" and spitting, but states baby has been doing better  Denies HA, visual changes, RUQ/epigastric pain              Tolerating po/ No nausea or vomiting / Denies dizziness or SOB             Bleeding is light             Pain controlled with Motrin             Up ad lib / ambulatory / voiding QS  Newborn breast feeding with formula - has not been able to express colostrum today; hx of low milk supply with last baby. Saw lactation yesterday evening.   O:               VS: BP 134/83 (BP Location: Right Arm)   Pulse 73   Temp 98.7 F (37.1 C) (Oral)   Resp 16   Ht 5\' 4"  (1.626 m)   Wt 89.9 kg   SpO2 97%   Breastfeeding Unknown   BMI 34.00 kg/m  04/19/18 0503  -  73  -  16  134/83  Sitting  -  -  - FC   04/19/18 0025  98.7 F (37.1 C)  66  -  16  113/64  Sitting  -  -  - FC   04/18/18 2100  98 F (36.7 C)  75  -  18  134/80  Semi-fowlers  97 %  -  - EW   04/18/18 1742  98.1 F (36.7 C)  60  -  16  124/66  Semi-fowlers  -  -  - DF   04/18/18 1629  97.5 F (36.4 C)Abnormal   63  -  16  111/62  -  98 %  -  - DF   04/18/18 1615  -  62  -  15  129/70  -  -  -  - AL   04/18/18 1610  -  84  -  15  124/74  -  -  -  - AL   04/18/18 1605  -  61  -  15  120/63  -  -  -  - AL   04/18/18 1600  -  70  -  15  119/69  -  -  -  - AL   04/18/18 1555  -  76  -  15  128/67  -  -  -  - AL   04/18/18 1550  -  67  -  15  117/64  -  -  -  - AL   04/18/18 1546  -  72  -  15  132/65  -  -  -  - AL   04/18/18 1545  -  74  -  15  130/59Abnormal   -  -  -  - AL   04/18/18 1535  -  84  -  15  112/77  -  -  -  - AL   04/18/18 1530  -  85  -  17  125/85  -  -  -  - AL   04/18/18 1525  -  81  -  16  101/66  -  -  -  - AL   04/18/18 1520  -  68  -  16  126/67  -  -  -  - AL   04/18/18 1515  -  66  -  16  116/60  -  -  -  - AL   04/18/18 1510  -  77  -  16  144/68Abnormal   -  -  -  - AL   04/18/18  1505  -  72  -  17  122/66  -  -  -  - AL   04/18/18 1500  -  87  -  18  130/64  -  -  -  - AL   04/18/18 1455  -  79  -  18  121/76  -  -  -  - AL   04/18/18 1450  -  83  -  18  141/70Abnormal   -  -  -  - AL      LABS:              Recent Labs    04/18/18 0826 04/19/18 0544  WBC 12.1* 16.0*  HGB 14.0 11.4*  PLT 156 134*               Blood type: --/--/A POS (02/09 0826)  Rubella: Immune (07/23 0000)                     I&O: Intake/Output      02/09 0701 - 02/10 0700 02/10 0701 - 02/11 0700   Urine (mL/kg/hr) 0    Blood 523    Total Output 523    Net -523         Urine Occurrence 1 x                  Physical Exam:             Alert and oriented X3  Lungs: Clear and unlabored  Heart: regular rate and rhythm / no murmurs  Abdomen: soft, non-tender, non-distended              Fundus: firm, non-tender, U-E  Perineum: well approximated 1st degree repair, no edema, no erythema, no ecchymosis  Lochia: small, no clots   Extremities: trace pedal edema, no calf pain or tenderness, +3 DTRs bilaterally, no clonus bilaterally     A/P: PPD # 1, SVD  1st degree repair  ABL Anemia r/t retained placenta s/p manual removal    - Stable asymptomatic    - s/p Cefoxitin x 1 dose for manual removal   Gestational HTN   - Stable, asymptomatic    - BPs stable since delivery; last elevated BP yesterday at 3pm    - Has a blood pressure cuff at home, and will continue checking daily   - Pre-eclampsia and hypertension warning s/s reviewed   Gestational thrombocytopenia     - Platelets stable   Doing well - stable status  Routine post partum orders  See lactation today   May consider early discharge home this evening if baby does well  [facility] nurse visit in 1 week for BP check - I informed family connects   Discharge instructions and warning s/s reviewed, but will have RN call me for discharge orders this evening if they go home. Otherwise, we will plan for d/c home  tomorrow  Lars Pinks, MSN, CNM [facility]   Addendum at 4:10pm: Notified by RN that peds is going to discharge the baby home, and mom desires early discharge.  BPs have continued to be stable. Will discharge home. Instructed RN to give [facility] discharge booklet.  I reviewed discharge teaching during my visit earlier today. Advised RN to review discharge instructions and pre-eclampsia warning s/s. Continue checking home BPs daily and call office if SBP >140 and/or DBP >90. Family connects nurse visit scheduled in 1 week for BP check. F/u with Dr. Benjie Karvonen in 6 weeks for [facility] visit.   Lars Pinks, CNM

## 2018-04-19 NOTE — Lactation Note (Signed)
This note was copied from a baby's chart. Lactation Consultation Note  Patient Name: Lynn West UTMLY'Y Date: 04/19/2018 Reason for consult: Follow-up assessment;Mother's request;Early term 37-38.6wks;Breast augmentation  [facility] [place] visit today ( dad came out to the desk asking for the [facility] )  Baby already latched with depth / more nutritive this latch compared to earlier.  Baby STS and per mom had pre- pumped with hand pump/ more swallows.  Baby fed for 12 mins / had a med wet and quarter size black to mec stool.  LC recommended to mom to try to get some rest and a nap.   Maternal Data Has patient been taught Hand Expression?: Yes  Feeding Feeding Type: Breast Fed  LATCH Score Latch: (baby latched on the right breast / depth )  Audible Swallowing: (few swallows noted / baby noted to be more  nutritive )  Type of Nipple: (nipple well rounded when baby released )  Comfort (Breast/Nipple): (per mom comfortable )  Hold (Positioning): (mom independent with latch )  LATCH Score: 8  Interventions Interventions: Breast feeding basics reviewed;Pre-pump if needed  Lactation Tools Discussed/Used     Consult Status Consult Status: Follow-up Date: 04/20/18 Follow-up type: In-patient    [place] 04/19/2018, 12:36 PM

## 2018-04-19 NOTE — Discharge Summary (Signed)
Obstetric Discharge Summary   Patient Name: Lynn West DOB: 1985/09/16 MRN: [id number]  Date of Admission: 04/18/2018 Date of Discharge: 04/19/2018 Date of Delivery: 04/18/2018 Gestational Age at Delivery: [redacted]w[redacted]d  Primary OB: [facility] - Dr. Benjie Karvonen  Antepartum complications:  Single umbilical artery, normal fetal echo, normal kidneys on sono, normal growth sono q 4 wks in 3rd trim, NST reactive wkly from 36 wks.  Labile elevated BPs in 3rd trim, most in 130s/ high 80s, home BPs mostly 120s/ 80s.. Asymptomatic patient. [place] labs normal 03/29/18 with P/c ratio 0.2 but platelets 121  (h/o gestational thrombocytopenia with G1 as well) G1- Boy, term, rapid labor.  H/o Mullerian uterine anomaly, uterine septum resection 2015, h/o vag septum resection in 2004  Prenatal Labs:  ABO, Rh: A/Positive/-- (07/23 0000) Antibody: Negative (07/23 0000) Rubella: Immune (07/23 0000) RPR: Nonreactive (07/23 0000)  HBsAg: Negative (07/23 0000)  HIV: Non-reactive (07/23 0000)  GBS: Positive (01/13 0000)  Admitting Diagnosis: IOL at 38+6 weeks for labile elevated BPs and single umbilical artery   Secondary Diagnoses: Patient Active Problem List   Diagnosis Date Noted  . Postpartum care following vaginal delivery (2/9) 04/19/2018  . First degree perineal laceration 04/19/2018  . Encounter for elective induction of labor 04/18/2018  . SVD (spontaneous vaginal delivery) 04/18/2018  . Cough 03/08/2018  . Vitamin D deficiency 09/29/2016  . New daily persistent headache 06/23/2016  . Palpitations 02/15/2016  . B12 deficiency anemia 01/23/2016  . GERD (gastroesophageal reflux disease) 09/14/2015  . Routine general medical examination at a health care facility 08/18/2014  . Anxiety   . 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 Benjie Karvonen Delivery Type: spontaneous  vaginal delivery Anesthesia: epidural Placenta: retained placenta, manually removed Laceration: 1st degree Episiotomy: none  Newborn Data: Live born female  Birth Weight: 6 lb 10 oz (3005 g) APGAR: 8, 9  Newborn Delivery   Birth date/time:  04/18/2018 13:43:00 Delivery type:  Vaginal, Spontaneous        Hospital/Postpartum Course  (Vaginal Delivery): Pt. Admitted for IOL at 38+6 weeks for labile BPs and single umbilical artery.  See notes and delivery summary for details.  Delivery note discusses process for manual removal of retained placenta. See for details. Patient had an uncomplicated postpartum course.  By time of discharge on PPD#1, her pain was controlled on oral pain medications; she had appropriate lochia and was ambulating, voiding without difficulty and tolerating regular diet.  Her BPs are stable, and no neural s/s.  Her labs are also stable. She was deemed stable for discharge to home.    Labs: CBC Latest Ref Rng & Units 04/19/2018 04/18/2018 06/23/2016  WBC 4.0 - 10.5 K/uL 16.0(H) 12.1(H) 5.7  Hemoglobin 12.0 - 15.0 g/dL 11.4(L) 14.0 12.2  Hematocrit 36.0 - 46.0 % 35.0(L) 41.8 36.5  Platelets 150 - 400 K/uL 134(L) 156 193.0   A POS  Physical exam:  BP 132/87 (BP Location: Left Arm)   Pulse 72   Temp 98.1 F (36.7 C) (Oral)   Resp 17   Ht 5\' 4"  (1.626 m)   Wt 89.9 kg   SpO2 97%   Breastfeeding Unknown   BMI 34.00 kg/m  General: alert and no distress Pulm: normal respiratory effort Lochia: appropriate Abdomen: soft, NT Uterine Fundus: firm, below umbilicus Perineum: healing well, no significant erythema, no significant edema Extremities: No evidence of DVT seen on  physical exam. Trace pedal extremity edema.   Disposition: stable, discharge to home Baby Feeding: breast milk and formula Baby Disposition: home with mom  Contraception: Mirena IUD  Rh Immune globulin given: N/A Rubella vaccine given: N/A Tdap vaccine given in AP or PP setting: UTD  2019 Flu vaccine given in AP or PP setting: UTD 2019   Plan:  Lynn West was discharged to home in good condition. Follow-up appointment at [facility] in 6 weeks.  [facility] visit in 1 week for BP check.   Discharge Instructions: Per After Visit Summary. Refer to After Visit Summary and [facility] discharge booklet  Activity: Advance as tolerated. Pelvic rest for 6 weeks.   Diet: Regular, Heart Healthy Discharge Medications: Allergies as of 04/19/2018   No Known Allergies     Medication List    TAKE these medications   albuterol 108 (90 Base) MCG/ACT inhaler Commonly known as:  PROVENTIL HFA;VENTOLIN HFA Inhale 2 puffs into the lungs every 6 (six) hours as needed for wheezing.   famotidine 40 MG tablet Commonly known as:  PEPCID Take 1 tablet (40 mg total) by mouth 2 (two) times daily.   ibuprofen 600 MG tablet Commonly known as:  ADVIL,MOTRIN Take 1 tablet (600 mg total) by mouth every 6 (six) hours.   PRENATAL PO Take by mouth.   VITAMIN D PO Take by mouth.            Discharge Care Instructions  (From admission, onward)         Start     Ordered   04/19/18 0000  Discharge wound care:    Comments:  Warm water sitz baths as needed   04/19/18 1623         Outpatient follow up:  Follow-up Information    Azucena Fallen, MD. Schedule an appointment as soon as possible for a visit in 1 week(s).   Specialty:  Obstetrics and Gynecology Why:  [facility] nurse visit in 1 week for BP check, then 6 week postpartum visit Contact information: [place] [address] [phone]           Signed:  Lars Pinks, MSN, CNM [facility]

## 2018-04-19 NOTE — Progress Notes (Signed)
MOB was referred for history of depression/anxiety. * Referral screened out by Clinical Social Worker because none of the following criteria appear to apply: ~ History of anxiety/depression during this pregnancy, or of post-partum depression following prior delivery. ~ Diagnosis of anxiety and/or depression within last 3 years OR * MOB's symptoms currently being treated with medication and/or therapy. Please contact the Clinical Social Worker if needs arise, by MOB request, or if MOB scores greater than 9/yes to question 10 on Edinburgh Postpartum Depression Screen.  Saverio Kader, LCSW Clinical Social Worker  [facility] [place]  [phone]  

## 2018-04-19 NOTE — Lactation Note (Signed)
This note was copied from a baby's chart. Lactation Consultation Note  Patient Name: Lynn West TDVVO'H Date: 04/19/2018 Reason for consult: Follow-up assessment;Mother's request;Early term 37-38.6wks;Breast augmentation  Per mom had breast changes with 1st baby and this pregnancy. 1st baby had a tongue tie with tx and got used  To the bottle / and latching problems . Per mom milk came in / but not a lot.  Baby is 6 hours old  Has voided x 3 and only 1 smear.  Breast fed x 9 10 -45 mins .  At the start of the consult / baby was already latched STS / and LC noted the baby be non - nutritive and  Recommended to mom to stimulate baby / baby still sluggish so LC recommended releasing suction.  Per mom baby has been spitty due to a fast delivery, and was gaggy after mom released baby but didn't spit up.  Baby more awake and mom re- latched / depth achieved. LC showed mom breast compressions and feeding pattern  Improved few swallows/.fed for 10 mins.  [place] suspects baby is sluggish since she has only had a mec smear and has been spitty. Also per mom has not  Gotten much rest or sleep since delivery.  LC recommended and instructed mom prior to latch - breast massage, hand express, pre-pump to prime the milk  Ducts to enhance let down.  LC mentioned to mom it would be important to see the baby more nutritive.  Per mom may be able to go home later.    Maternal Data Has patient been taught Hand Expression?: Yes  Feeding Feeding Type: Breast Fed  LATCH Score Latch: Grasps breast easily, tongue down, lips flanged, rhythmical sucking.  Audible Swallowing: A few with stimulation  Type of Nipple: Everted at rest and after stimulation  Comfort (Breast/Nipple): Soft / non-tender  Hold (Positioning): Assistance needed to correctly position infant at breast and maintain latch.  LATCH Score: 8  Interventions Interventions: Breast feeding basics reviewed;Skin to skin  Lactation Tools  Discussed/Used     Consult Status Consult Status: Follow-up Date: 04/20/18 Follow-up type: In-patient    [place] 04/19/2018, 11:52 AM

## 2018-04-26 ENCOUNTER — Inpatient Hospital Stay (HOSPITAL_COMMUNITY): Admission: AD | Admit: 2018-04-26 | Payer: 59 | Source: Ambulatory Visit | Admitting: Obstetrics & Gynecology

## 2018-05-04 DIAGNOSIS — O135 Gestational [pregnancy-induced] hypertension without significant proteinuria, complicating the puerperium: Secondary | ICD-10-CM | POA: Diagnosis not present

## 2018-06-01 DIAGNOSIS — Z30430 Encounter for insertion of intrauterine contraceptive device: Secondary | ICD-10-CM | POA: Diagnosis not present

## 2018-07-29 ENCOUNTER — Encounter: Payer: Self-pay | Admitting: Internal Medicine

## 2018-09-07 ENCOUNTER — Encounter: Payer: Self-pay | Admitting: Internal Medicine

## 2018-09-16 ENCOUNTER — Other Ambulatory Visit: Payer: Self-pay

## 2018-09-16 ENCOUNTER — Encounter: Payer: Self-pay | Admitting: Internal Medicine

## 2018-09-16 ENCOUNTER — Ambulatory Visit (INDEPENDENT_AMBULATORY_CARE_PROVIDER_SITE_OTHER): Payer: 59 | Admitting: Internal Medicine

## 2018-09-16 DIAGNOSIS — D519 Vitamin B12 deficiency anemia, unspecified: Secondary | ICD-10-CM

## 2018-09-16 DIAGNOSIS — K219 Gastro-esophageal reflux disease without esophagitis: Secondary | ICD-10-CM | POA: Diagnosis not present

## 2018-09-16 DIAGNOSIS — Z0000 Encounter for general adult medical examination without abnormal findings: Secondary | ICD-10-CM | POA: Diagnosis not present

## 2018-09-16 DIAGNOSIS — E559 Vitamin D deficiency, unspecified: Secondary | ICD-10-CM

## 2018-09-16 NOTE — Assessment & Plan Note (Signed)
Taking pepcid with good control.

## 2018-09-16 NOTE — Assessment & Plan Note (Signed)
Checking levels, off replacement currently.

## 2018-09-16 NOTE — Assessment & Plan Note (Signed)
Checking vitamin D level.  

## 2018-09-16 NOTE — Progress Notes (Signed)
Virtual Visit via Video Note  I connected with Lynn West on 09/16/18 at 11:00 AM EDT by a video enabled telemedicine application and verified that I am speaking with the correct person using two identifiers.  The patient and the provider were at separate locations throughout the entire encounter.   I discussed the limitations of evaluation and management by telemedicine and the availability of in person appointments. The patient expressed understanding and agreed to proceed.  History of Present Illness: The patient is a 33 y.o. female with visit for physical. No new concerns had baby February and just back to work this week.  PMH, [place], social history reviewed and updated  Observations/Objective: Appearance: normal, breathing appears normal, casual grooming, abdomen does not appear distended, throat normal, memory normal, mental status is A and O times 3  Assessment and Plan: See problem oriented charting  Follow Up Instructions: labs, get records for pap smear  I discussed the assessment and treatment plan with the patient. The patient was provided an opportunity to ask questions and all were answered. The patient agreed with the plan and demonstrated an understanding of the instructions.   The patient was advised to call back or seek an in-person evaluation if the symptoms worsen or if the condition fails to improve as anticipated.  Hoyt Koch, MD

## 2018-09-16 NOTE — Assessment & Plan Note (Signed)
Flu shot yearly. Tetanus up to date. Pap smear up to date getting records. Counseled about sun safety and mole surveillance. Counseled about the dangers of distracted driving. Given 10 year screening recommendations.

## 2018-09-17 ENCOUNTER — Telehealth: Payer: Self-pay

## 2018-09-17 ENCOUNTER — Other Ambulatory Visit: Payer: Self-pay

## 2018-09-17 MED ORDER — FAMOTIDINE 20 MG PO TABS: 20.0000 mg | ORAL_TABLET | Freq: Two times a day (BID) | ORAL | 3 refills | Status: DC

## 2018-09-17 MED FILL — SM ACID REDUCER 20 MG TAB: 20 | 25 days supply | Qty: 50 | Fill #0

## 2018-09-17 NOTE — Telephone Encounter (Signed)
Patient is on Pepcid 40 mg BID. Requests to decrease to Pepcid 20 mg BID. Okay to refill?

## 2018-09-21 ENCOUNTER — Other Ambulatory Visit (INDEPENDENT_AMBULATORY_CARE_PROVIDER_SITE_OTHER): Payer: 59

## 2018-09-21 DIAGNOSIS — Z0000 Encounter for general adult medical examination without abnormal findings: Secondary | ICD-10-CM

## 2018-09-21 LAB — COMPREHENSIVE METABOLIC PANEL
ALT: 20 U/L (ref 0–35)
AST: 17 U/L (ref 0–37)
Albumin: 4.6 g/dL (ref 3.5–5.2)
Alkaline Phosphatase: 58 U/L (ref 39–117)
BUN: 17 mg/dL (ref 6–23)
CO2: 25 mEq/L (ref 19–32)
Calcium: 9.2 mg/dL (ref 8.4–10.5)
Chloride: 106 mEq/L (ref 96–112)
Creatinine, Ser: 0.74 mg/dL (ref 0.40–1.20)
GFR: 89.5 mL/min (ref >60.00)
Glucose, Bld: 95 mg/dL (ref 70–99)
Potassium: 4.2 mEq/L (ref 3.5–5.1)
Sodium: 139 mEq/L (ref 135–145)
Total Bilirubin: 0.5 mg/dL (ref 0.2–1.2)
Total Protein: 7 g/dL (ref 6.0–8.3)

## 2018-09-21 LAB — CBC
HCT: 34.9 % — ABNORMAL LOW (ref 36.0–46.0)
Hemoglobin: 11.8 g/dL — ABNORMAL LOW (ref 12.0–15.0)
MCHC: 33.8 g/dL (ref 30.0–36.0)
MCV: 87.6 fl (ref 78.0–100.0)
Platelets: 183 10*3/uL (ref 150.0–400.0)
RBC: 3.98 Mil/uL (ref 3.87–5.11)
RDW: 13.9 % (ref 11.5–15.5)
WBC: 6.2 10*3/uL (ref 4.0–10.5)

## 2018-09-21 LAB — VITAMIN D 25 HYDROXY (VIT D DEFICIENCY, FRACTURES): VITD: 32.65 ng/mL (ref 30.00–100.00)

## 2018-09-21 LAB — VITAMIN B12: Vitamin B-12: 343 pg/mL (ref 211–911)

## 2018-09-21 LAB — LIPID PANEL
Cholesterol: 158 mg/dL (ref 0–200)
HDL: 49 mg/dL (ref >39.00)
LDL Cholesterol: 94 mg/dL (ref 0–99)
NonHDL: 108.85
Total CHOL/HDL Ratio: 3
Triglycerides: 74 mg/dL (ref 0.0–149.0)
VLDL: 14.8 mg/dL (ref 0.0–40.0)

## 2018-10-01 ENCOUNTER — Encounter: Payer: Self-pay | Admitting: Internal Medicine

## 2018-10-01 ENCOUNTER — Other Ambulatory Visit: Payer: Self-pay

## 2018-10-01 MED ORDER — PANTOPRAZOLE SODIUM 40 MG PO TBEC: 40.0000 mg | DELAYED_RELEASE_TABLET | Freq: Every day | ORAL | 3 refills | Status: DC

## 2018-10-01 MED FILL — PANTOPRAZOLE SOD DR 40 MG T: 40 | 30 days supply | Qty: 30 | Fill #0

## 2018-10-01 NOTE — Progress Notes (Signed)
Patient has been taking Pepcid 20 mg daily. She began to develop a globus sensation. Increased the Pepcid to BID. This has not helped as much as quickly as she thought it would. Asks if she can resume Pantoprazole 40 mg daily for a while.

## 2018-10-04 NOTE — Progress Notes (Signed)
Okay to start Protonix 40 mg daily, use Pepcid at bedtime as needed.

## 2018-11-09 MED FILL — PANTOPRAZOLE SOD DR 40 MG T: 40 | 30 days supply | Qty: 30 | Fill #1

## 2018-11-10 DIAGNOSIS — R0989 Other specified symptoms and signs involving the circulatory and respiratory systems: Secondary | ICD-10-CM | POA: Diagnosis not present

## 2018-11-10 DIAGNOSIS — K219 Gastro-esophageal reflux disease without esophagitis: Secondary | ICD-10-CM | POA: Diagnosis not present

## 2018-11-16 DIAGNOSIS — L821 Other seborrheic keratosis: Secondary | ICD-10-CM | POA: Diagnosis not present

## 2018-11-16 DIAGNOSIS — D2261 Melanocytic nevi of right upper limb, including shoulder: Secondary | ICD-10-CM | POA: Diagnosis not present

## 2018-11-16 DIAGNOSIS — N644 Mastodynia: Secondary | ICD-10-CM | POA: Diagnosis not present

## 2018-11-16 DIAGNOSIS — D1801 Hemangioma of skin and subcutaneous tissue: Secondary | ICD-10-CM | POA: Diagnosis not present

## 2018-11-16 DIAGNOSIS — D485 Neoplasm of uncertain behavior of skin: Secondary | ICD-10-CM | POA: Diagnosis not present

## 2018-11-16 DIAGNOSIS — D225 Melanocytic nevi of trunk: Secondary | ICD-10-CM | POA: Diagnosis not present

## 2018-11-16 DIAGNOSIS — R921 Mammographic calcification found on diagnostic imaging of breast: Secondary | ICD-10-CM | POA: Diagnosis not present

## 2018-11-16 DIAGNOSIS — L700 Acne vulgaris: Secondary | ICD-10-CM | POA: Diagnosis not present

## 2018-11-18 ENCOUNTER — Telehealth: Payer: Self-pay | Admitting: *Deleted

## 2018-11-18 MED ORDER — PANTOPRAZOLE SODIUM 40 MG PO TBEC: 40.0000 mg | DELAYED_RELEASE_TABLET | Freq: Two times a day (BID) | ORAL | 3 refills | Status: DC

## 2018-11-18 NOTE — Telephone Encounter (Signed)
Can patient have refills of Protonix

## 2018-11-18 NOTE — Telephone Encounter (Signed)
protonix increase sent to pharmacy

## 2018-11-18 NOTE — Telephone Encounter (Signed)
Persistent globus sensation, requiring constant throat clearing. No dysphagia or odynophagia.  Will increase Protonix to twice daily X 2-3 months.  Saw ENT last week, normal posterior pharynx and vocal cords Advised her to contact PMD to check Thyroid/TSH Cont antihistamine for seasonal allergies. If persistent symptoms will consider EGD for further evaluation with biopsies to exclude EOE/reflux related changes

## 2018-11-24 MED FILL — PANTOPRAZOLE SOD DR 40 MG T: 40 | 90 days supply | Qty: 180 | Fill #0

## 2019-01-04 DIAGNOSIS — D485 Neoplasm of uncertain behavior of skin: Secondary | ICD-10-CM | POA: Diagnosis not present

## 2019-01-04 DIAGNOSIS — L905 Scar conditions and fibrosis of skin: Secondary | ICD-10-CM | POA: Diagnosis not present

## 2019-01-06 DIAGNOSIS — R42 Dizziness and giddiness: Secondary | ICD-10-CM | POA: Diagnosis not present

## 2019-01-06 DIAGNOSIS — Z7289 Other problems related to lifestyle: Secondary | ICD-10-CM | POA: Diagnosis not present

## 2019-01-06 DIAGNOSIS — K219 Gastro-esophageal reflux disease without esophagitis: Secondary | ICD-10-CM | POA: Diagnosis not present

## 2019-01-13 ENCOUNTER — Ambulatory Visit (INDEPENDENT_AMBULATORY_CARE_PROVIDER_SITE_OTHER): Payer: 59 | Admitting: Internal Medicine

## 2019-01-13 ENCOUNTER — Other Ambulatory Visit (INDEPENDENT_AMBULATORY_CARE_PROVIDER_SITE_OTHER): Payer: 59

## 2019-01-13 ENCOUNTER — Encounter: Payer: Self-pay | Admitting: Internal Medicine

## 2019-01-13 ENCOUNTER — Other Ambulatory Visit: Payer: Self-pay

## 2019-01-13 VITALS — BP 122/84 | HR 69 | Temp 97.9°F | Ht 64.0 in | Wt 175.0 lb

## 2019-01-13 DIAGNOSIS — H8113 Benign paroxysmal vertigo, bilateral: Secondary | ICD-10-CM | POA: Diagnosis not present

## 2019-01-13 DIAGNOSIS — H8110 Benign paroxysmal vertigo, unspecified ear: Secondary | ICD-10-CM | POA: Insufficient documentation

## 2019-01-13 DIAGNOSIS — R1310 Dysphagia, unspecified: Secondary | ICD-10-CM | POA: Diagnosis not present

## 2019-01-13 LAB — TSH: TSH: 0.63 u[IU]/mL (ref 0.35–4.50)

## 2019-01-13 LAB — T4, FREE: Free T4: 0.9 ng/dL (ref 0.60–1.60)

## 2019-01-13 NOTE — Patient Instructions (Signed)
We will do the labs today and get the ultrasound of the thyroid.

## 2019-01-13 NOTE — Progress Notes (Signed)
   Subjective:   Patient ID: Lynn West, female    DOB: [birth year], 33 y.o.   MRN: [id number]  HPI The patient is a 33 YO female coming in for concerns about balance and GI swallowing sensation. Just increased protonix to BID for 6 weeks after seeing ENT for globus sensation and this has not helped. She went back to once a day in the last week or so. ENT did scope and no problems with the posterior pharynx or vocal cords. Recent pregnancy delivered February. She is also having some vertigo stuff. Denies fevers or chills. Denies ear pain or drainage. Does have allergies but taking meds and this has helped with allergy symptoms. Has done dix-hallpike maneuver when symptoms were bad with a meclizine and this did help temporarily.   Review of Systems  Constitutional: Negative.   HENT: Positive for trouble swallowing.   Eyes: Negative.   Respiratory: Negative for cough, chest tightness and shortness of breath.   Cardiovascular: Negative for chest pain, palpitations and leg swelling.  Gastrointestinal: Negative for abdominal distention, abdominal pain, constipation, diarrhea, nausea and vomiting.  Musculoskeletal: Negative.   Skin: Negative.   Neurological: Positive for dizziness. Negative for tremors, speech difficulty, weakness and light-headedness.  Psychiatric/Behavioral: Negative.     Objective:  Physical Exam Constitutional:      Appearance: She is well-developed.  HENT:     Head: Normocephalic and atraumatic.     Right Ear: Tympanic membrane, ear canal and external ear normal.     Left Ear: Tympanic membrane, ear canal and external ear normal.  Neck:     Musculoskeletal: Normal range of motion. No muscular tenderness.  Cardiovascular:     Rate and Rhythm: Normal rate and regular rhythm.  Pulmonary:     Effort: Pulmonary effort is normal. No respiratory distress.     Breath sounds: Normal breath sounds. No wheezing or rales.  Abdominal:     General: Bowel sounds are normal.  There is no distension.     Palpations: Abdomen is soft.     Tenderness: There is no abdominal tenderness. There is no rebound.  Lymphadenopathy:     Cervical: No cervical adenopathy.  Skin:    General: Skin is warm and dry.  Neurological:     Mental Status: She is alert and oriented to person, place, and time.     Coordination: Coordination normal.     Vitals:   01/13/19 0824  BP: 122/84  Pulse: 69  Temp: 97.9 F (36.6 C)  TempSrc: Oral  SpO2: 99%  Weight: 175 lb (79.4 kg)  Height: 5\' 4"  (1.626 m)    Assessment & Plan:  Visit time 25 minutes: greater than 50% of that time was spent in face to face counseling and coordination of care with the patient: counseled about various problems and etiology and potential workup and treatment.

## 2019-01-13 NOTE — Assessment & Plan Note (Addendum)
Checking TSH and free T4 and thyroid US to rule out thyroid nodule with family history hypothyroidism.

## 2019-01-13 NOTE — Assessment & Plan Note (Signed)
Given epley maneuver and advised to do BID 1-2 weeks to see if this helps and if not vestibular therapy to help.

## 2019-01-14 ENCOUNTER — Encounter: Payer: Self-pay | Admitting: Internal Medicine

## 2019-01-14 NOTE — Progress Notes (Signed)
Abstracted and sent to scan  

## 2019-01-20 ENCOUNTER — Encounter: Payer: Self-pay | Admitting: Internal Medicine

## 2019-01-24 ENCOUNTER — Encounter: Payer: Self-pay | Admitting: Internal Medicine

## 2019-01-24 DIAGNOSIS — H52203 Unspecified astigmatism, bilateral: Secondary | ICD-10-CM | POA: Diagnosis not present

## 2019-01-24 DIAGNOSIS — H5213 Myopia, bilateral: Secondary | ICD-10-CM | POA: Diagnosis not present

## 2019-01-26 MED ORDER — BUPROPION HCL ER (XL) 150 MG PO TB24: 150.0000 mg | ORAL_TABLET | Freq: Every day | ORAL | 3 refills | Status: DC

## 2019-01-26 MED FILL — buPROPion HCL ER (XL) 150 M: 150 | 30 days supply | Qty: 30 | Fill #0

## 2019-01-31 ENCOUNTER — Ambulatory Visit
Admission: RE | Admit: 2019-01-31 | Discharge: 2019-01-31 | Disposition: A | Discharge status: Home / Self Care | Payer: 59 | Source: Ambulatory Visit | Attending: Internal Medicine | Admitting: Internal Medicine

## 2019-01-31 DIAGNOSIS — R1310 Dysphagia, unspecified: Secondary | ICD-10-CM

## 2019-01-31 DIAGNOSIS — F458 Other somatoform disorders: Secondary | ICD-10-CM | POA: Diagnosis not present

## 2019-02-14 ENCOUNTER — Other Ambulatory Visit: Payer: Self-pay | Admitting: Obstetrics & Gynecology

## 2019-02-14 DIAGNOSIS — N644 Mastodynia: Secondary | ICD-10-CM

## 2019-02-14 DIAGNOSIS — Z803 Family history of malignant neoplasm of breast: Secondary | ICD-10-CM

## 2019-02-21 MED FILL — buPROPion HCL ER (XL) 150 M: 150 | 30 days supply | Qty: 30 | Fill #1

## 2019-03-10 ENCOUNTER — Ambulatory Visit
Admission: RE | Admit: 2019-03-10 | Discharge: 2019-03-10 | Disposition: A | Discharge status: Home / Self Care | Payer: 59 | Source: Ambulatory Visit | Attending: Obstetrics & Gynecology | Admitting: Obstetrics & Gynecology

## 2019-03-10 ENCOUNTER — Other Ambulatory Visit: Payer: Self-pay

## 2019-03-10 DIAGNOSIS — Z803 Family history of malignant neoplasm of breast: Secondary | ICD-10-CM

## 2019-03-10 DIAGNOSIS — N644 Mastodynia: Secondary | ICD-10-CM | POA: Diagnosis not present

## 2019-03-10 MED ORDER — GADOBUTROL 1 MMOL/ML IV SOLN
8.0000 mL | Freq: Once | INTRAVENOUS | Status: AC | PRN
  Administered 2019-03-10: 8 mL via INTRAVENOUS

## 2019-03-22 MED FILL — buPROPion HCL ER (XL) 150 M: 150 | 30 days supply | Qty: 30 | Fill #2

## 2019-04-20 MED FILL — PANTOPRAZOLE SOD DR 40 MG T: 40 | 90 days supply | Qty: 180 | Fill #1

## 2019-04-20 MED FILL — buPROPion HCL ER (XL) 150 M: 150 | 30 days supply | Qty: 30 | Fill #3

## 2019-04-24 ENCOUNTER — Encounter: Payer: Self-pay | Admitting: Internal Medicine

## 2019-04-26 MED ORDER — BENZOYL PEROXIDE-ERYTHROMYCIN 5-3 % EX GEL: Freq: Two times a day (BID) | CUTANEOUS | 6 refills | Status: DC

## 2019-04-26 MED FILL — ERYTHROMYCIN-BENZOYL GEL: 5-3 | 60 days supply | Qty: 47 | Fill #0

## 2019-05-23 MED FILL — buPROPion HCL ER (XL) 150 M: 150 | 30 days supply | Qty: 30 | Fill #4

## 2019-06-21 MED FILL — buPROPion HCL ER (XL) 150 M: 150 | 30 days supply | Qty: 30 | Fill #5

## 2019-06-27 DIAGNOSIS — Z01419 Encounter for gynecological examination (general) (routine) without abnormal findings: Secondary | ICD-10-CM | POA: Diagnosis not present

## 2019-06-27 DIAGNOSIS — L909 Atrophic disorder of skin, unspecified: Secondary | ICD-10-CM | POA: Diagnosis not present

## 2019-06-27 DIAGNOSIS — Z30431 Encounter for routine checking of intrauterine contraceptive device: Secondary | ICD-10-CM | POA: Diagnosis not present

## 2019-06-27 DIAGNOSIS — Z6829 Body mass index (BMI) 29.0-29.9, adult: Secondary | ICD-10-CM | POA: Diagnosis not present

## 2019-06-27 MED FILL — SPIRONOLACTONE 50 MG TABLET: 50 | 90 days supply | Qty: 90 | Fill #0

## 2019-09-26 ENCOUNTER — Other Ambulatory Visit (HOSPITAL_COMMUNITY): Payer: Self-pay | Admitting: Obstetrics & Gynecology

## 2019-09-26 MED FILL — JASMIEL 3-0.02 MG TABS: 3-0.02 | 84 days supply | Qty: 84 | Fill #0

## 2019-10-19 MED FILL — buPROPion HCL ER (XL) 150 M: 150 | 90 days supply | Qty: 90 | Fill #7

## 2019-10-19 MED FILL — PANTOPRAZOLE SOD DR 40 MG T: 40 | 90 days supply | Qty: 180 | Fill #2

## 2019-10-26 ENCOUNTER — Other Ambulatory Visit (INDEPENDENT_AMBULATORY_CARE_PROVIDER_SITE_OTHER): Payer: 59

## 2019-10-26 ENCOUNTER — Telehealth: Payer: Self-pay | Admitting: Gastroenterology

## 2019-10-26 DIAGNOSIS — R197 Diarrhea, unspecified: Secondary | ICD-10-CM

## 2019-10-26 DIAGNOSIS — R194 Change in bowel habit: Secondary | ICD-10-CM

## 2019-10-26 LAB — CBC WITH DIFFERENTIAL/PLATELET
Basophils Absolute: 0 10*3/uL (ref 0.0–0.1)
Basophils Relative: 0.4 % (ref 0.0–3.0)
Eosinophils Absolute: 0.1 10*3/uL (ref 0.0–0.7)
Eosinophils Relative: 0.9 % (ref 0.0–5.0)
HCT: 38.4 % (ref 36.0–46.0)
Hemoglobin: 12.8 g/dL (ref 12.0–15.0)
Lymphocytes Relative: 32.5 % (ref 12.0–46.0)
Lymphs Abs: 2.5 10*3/uL (ref 0.7–4.0)
MCHC: 33.2 g/dL (ref 30.0–36.0)
MCV: 88.8 fl (ref 78.0–100.0)
Monocytes Absolute: 0.4 10*3/uL (ref 0.1–1.0)
Monocytes Relative: 5.4 % (ref 3.0–12.0)
Neutro Abs: 4.7 10*3/uL (ref 1.4–7.7)
Neutrophils Relative %: 60.8 % (ref 43.0–77.0)
Platelets: 214 10*3/uL (ref 150.0–400.0)
RBC: 4.33 Mil/uL (ref 3.87–5.11)
RDW: 13.5 % (ref 11.5–15.5)
WBC: 7.7 10*3/uL (ref 4.0–10.5)

## 2019-10-26 LAB — VITAMIN B12: Vitamin B-12: >1526 pg/mL — ABNORMAL HIGH (ref 211–911)

## 2019-10-26 LAB — IGA: IgA: 185 mg/dL (ref 68–378)

## 2019-10-26 LAB — SEDIMENTATION RATE: Sed Rate: 11 mm/hr (ref 0–20)

## 2019-10-26 LAB — FERRITIN: Ferritin: 52.3 ng/mL (ref 10.0–291.0)

## 2019-10-26 NOTE — Telephone Encounter (Signed)
Labs are in

## 2019-10-26 NOTE — Telephone Encounter (Signed)
Dao is having recent change in bowel habits with increased bowel frequency and intermittent diarrhea.  She feels her symptoms started after she went to [place] to visit her family, at that time her son had nausea vomiting and diarrhea. She has left lower abdomen discomfort and bloating.  Intermittent small-volume bright red blood per rectum when she wipes No recent change in diet.  She switched to almond milk a year ago, and has not noticed any changes when she consumes milk or dairy products. History of chronic heartburn, is on Protonix daily but continues to have intermittent not nausea and epigastric discomfort.  Review of labs positive for mild anemia with hemoglobin 11 and borderline low B12  Check CBC, ferritin, B12, ESR, IgA total and TTG IgA antibody level to exclude celiac disease GI pathogen panel to exclude any infectious etiology  If continues to have persistent symptoms, will consider EGD and colonoscopy for further evaluation  K. Denzil Magnuson , MD [phone]

## 2019-10-27 LAB — TISSUE TRANSGLUTAMINASE, IGA: (tTG) Ab, IgA: 1 U/mL

## 2019-12-28 MED FILL — JASMIEL 3-0.02 MG TABS: 3-0.02 | 84 days supply | Qty: 84 | Fill #1

## 2020-01-23 ENCOUNTER — Other Ambulatory Visit: Payer: Self-pay | Admitting: Internal Medicine

## 2020-01-30 ENCOUNTER — Telehealth: Payer: Self-pay | Admitting: Gastroenterology

## 2020-01-30 DIAGNOSIS — R1084 Generalized abdominal pain: Secondary | ICD-10-CM

## 2020-01-30 DIAGNOSIS — R1013 Epigastric pain: Secondary | ICD-10-CM

## 2020-01-30 DIAGNOSIS — R110 Nausea: Secondary | ICD-10-CM

## 2020-01-30 DIAGNOSIS — R197 Diarrhea, unspecified: Secondary | ICD-10-CM

## 2020-01-30 NOTE — Telephone Encounter (Signed)
Lynn West called with complaints of epigastric pain, nausea and intermittent diarrhea.  She also has intermittent discomfort extending to the left side and back of her abdomen.  She is worried about potential cancer though less likely based on her age.  IBD is in the differential. We will plan to proceed with CT abdomen and pelvis with contrast to evaluate and exclude any acute GI pathology

## 2020-01-31 ENCOUNTER — Encounter: Payer: Self-pay | Admitting: Internal Medicine

## 2020-01-31 NOTE — Addendum Note (Signed)
Addended by: Oda Kilts on: 01/31/2020 04:52 PM   Modules accepted: Orders

## 2020-01-31 NOTE — Telephone Encounter (Signed)
Got with patient and she will let me know when she wants to have her CT scan

## 2020-02-07 ENCOUNTER — Other Ambulatory Visit: Payer: Self-pay | Admitting: Internal Medicine

## 2020-02-07 MED FILL — buPROPion HCL ER (XL) 150 M: 150 | 15 days supply | Qty: 15 | Fill #0

## 2020-02-10 ENCOUNTER — Other Ambulatory Visit: Payer: Self-pay

## 2020-02-10 ENCOUNTER — Ambulatory Visit (INDEPENDENT_AMBULATORY_CARE_PROVIDER_SITE_OTHER)
Admission: RE | Admit: 2020-02-10 | Discharge: 2020-02-10 | Disposition: A | Discharge status: Home / Self Care | Payer: 59 | Source: Ambulatory Visit | Attending: Gastroenterology | Admitting: Gastroenterology

## 2020-02-10 DIAGNOSIS — R110 Nausea: Secondary | ICD-10-CM

## 2020-02-10 DIAGNOSIS — R1013 Epigastric pain: Secondary | ICD-10-CM

## 2020-02-10 DIAGNOSIS — R197 Diarrhea, unspecified: Secondary | ICD-10-CM | POA: Diagnosis not present

## 2020-02-10 DIAGNOSIS — R1084 Generalized abdominal pain: Secondary | ICD-10-CM | POA: Diagnosis not present

## 2020-02-10 DIAGNOSIS — R140 Abdominal distension (gaseous): Secondary | ICD-10-CM | POA: Diagnosis not present

## 2020-02-10 MED ORDER — IOHEXOL 300 MG/ML  SOLN
100.0000 mL | Freq: Once | INTRAMUSCULAR | Status: AC | PRN
  Administered 2020-02-10: 100 mL via INTRAVENOUS

## 2020-02-17 ENCOUNTER — Other Ambulatory Visit: Payer: Self-pay | Admitting: Internal Medicine

## 2020-02-17 ENCOUNTER — Telehealth (INDEPENDENT_AMBULATORY_CARE_PROVIDER_SITE_OTHER): Payer: 59 | Admitting: Internal Medicine

## 2020-02-17 ENCOUNTER — Encounter: Payer: Self-pay | Admitting: Internal Medicine

## 2020-02-17 DIAGNOSIS — K219 Gastro-esophageal reflux disease without esophagitis: Secondary | ICD-10-CM

## 2020-02-17 DIAGNOSIS — Z0000 Encounter for general adult medical examination without abnormal findings: Secondary | ICD-10-CM

## 2020-02-17 DIAGNOSIS — F419 Anxiety disorder, unspecified: Secondary | ICD-10-CM

## 2020-02-17 DIAGNOSIS — D519 Vitamin B12 deficiency anemia, unspecified: Secondary | ICD-10-CM

## 2020-02-17 MED ORDER — BUPROPION HCL ER (XL) 150 MG PO TB24: 150.0000 mg | ORAL_TABLET | Freq: Every day | ORAL | 3 refills | Status: DC

## 2020-02-17 MED ORDER — PANTOPRAZOLE SODIUM 40 MG PO TBEC: 40.0000 mg | DELAYED_RELEASE_TABLET | Freq: Every day | ORAL | 3 refills | Status: DC

## 2020-02-17 MED FILL — PANTOPRAZOLE SOD DR 40 MG T: 40 | 90 days supply | Qty: 90 | Fill #0

## 2020-02-17 NOTE — Progress Notes (Signed)
Virtual Visit via Video Note  I connected with Lynn West on 02/17/20 at  3:00 PM EST by a video enabled telemedicine application and verified that I am speaking with the correct person using two identifiers.  The patient and the provider were at separate locations throughout the entire encounter. Patient location: home, Provider location: work   I discussed the limitations of evaluation and management by telemedicine and the availability of in person appointments. The patient expressed understanding and agreed to proceed. The patient and the provider were the only parties present for the visit unless noted in HPI below.  History of Present Illness: The patient is a 34 y.o. female with visit for physical.   PMH, [place], social history reviewed and updated  Observations/Objective: Appearance: normal, breathing normal and A and O times 3, no coughing or dyspnea  Assessment and Plan: See problem oriented charting  Follow Up Instructions: no labs, up to date on immunizations. Will send copy of covi-19 card for dates.   I discussed the assessment and treatment plan with the patient. The patient was provided an opportunity to ask questions and all were answered. The patient agreed with the plan and demonstrated an understanding of the instructions.   The patient was advised to call back or seek an in-person evaluation if the symptoms worsen or if the condition fails to improve as anticipated.  Hoyt Koch, MD

## 2020-02-17 NOTE — Assessment & Plan Note (Signed)
Wellbutrin is still working well. Refilled today.

## 2020-02-17 NOTE — Assessment & Plan Note (Signed)
Recent levels at goal on oral replacement.

## 2020-02-17 NOTE — Assessment & Plan Note (Signed)
Flu shot up to date. Covid-19 up to date not eligible for booster yet. Tetanus up to date. Pap smear up to date with gyn. Counseled about sun safety and mole surveillance. Counseled about the dangers of distracted driving. Given 10 year screening recommendations.

## 2020-02-17 NOTE — Assessment & Plan Note (Signed)
Refilled protonix daily prn.

## 2020-02-20 MED FILL — buPROPion HCL ER (XL) 150 M: 150 | 90 days supply | Qty: 90 | Fill #0

## 2020-03-21 MED FILL — JASMIEL 3-0.02 MG TABS: 3-0.02 | 84 days supply | Qty: 84 | Fill #2

## 2020-05-26 ENCOUNTER — Encounter: Payer: Self-pay | Admitting: Internal Medicine

## 2020-05-28 ENCOUNTER — Other Ambulatory Visit: Payer: Self-pay | Admitting: Internal Medicine

## 2020-05-28 MED ORDER — ONDANSETRON 4 MG PO TBDP: 4.0000 mg | ORAL_TABLET | Freq: Three times a day (TID) | ORAL | 0 refills | Status: DC | PRN

## 2020-05-28 MED FILL — ONDANSETRON ODT 4 MG TABLET: 4 | 6 days supply | Qty: 20 | Fill #0

## 2020-06-13 ENCOUNTER — Other Ambulatory Visit: Payer: Self-pay

## 2020-06-13 ENCOUNTER — Other Ambulatory Visit: Payer: Self-pay | Admitting: Obstetrics & Gynecology

## 2020-06-13 ENCOUNTER — Other Ambulatory Visit (HOSPITAL_COMMUNITY): Payer: Self-pay

## 2020-06-14 ENCOUNTER — Other Ambulatory Visit (HOSPITAL_COMMUNITY): Payer: Self-pay

## 2020-06-14 MED ORDER — DROSPIRENONE-ETHINYL ESTRADIOL 3-0.02 MG PO TABS
1.0000 | ORAL_TABLET | Freq: Every day | ORAL | 0 refills | Status: DC
  Filled 2020-06-14: qty 28, 28d supply, fill #0

## 2020-07-05 ENCOUNTER — Other Ambulatory Visit (HOSPITAL_COMMUNITY): Payer: Self-pay

## 2020-07-06 ENCOUNTER — Other Ambulatory Visit (HOSPITAL_COMMUNITY): Payer: Self-pay

## 2020-07-06 MED FILL — Pantoprazole Sodium EC Tab 40 MG (Base Equiv): ORAL | 90 days supply | Qty: 90 | Fill #0 | Status: AC

## 2020-08-03 ENCOUNTER — Other Ambulatory Visit: Payer: 59

## 2020-08-03 ENCOUNTER — Telehealth: Payer: Self-pay | Admitting: Gastroenterology

## 2020-08-03 ENCOUNTER — Other Ambulatory Visit (HOSPITAL_COMMUNITY): Payer: Self-pay

## 2020-08-03 DIAGNOSIS — R197 Diarrhea, unspecified: Secondary | ICD-10-CM

## 2020-08-03 DIAGNOSIS — R194 Change in bowel habit: Secondary | ICD-10-CM

## 2020-08-03 DIAGNOSIS — Z0389 Encounter for observation for other suspected diseases and conditions ruled out: Secondary | ICD-10-CM | POA: Diagnosis not present

## 2020-08-03 DIAGNOSIS — A048 Other specified bacterial intestinal infections: Secondary | ICD-10-CM | POA: Diagnosis not present

## 2020-08-03 DIAGNOSIS — A09 Infectious gastroenteritis and colitis, unspecified: Secondary | ICD-10-CM

## 2020-08-03 DIAGNOSIS — A0472 Enterocolitis due to Clostridium difficile, not specified as recurrent: Secondary | ICD-10-CM

## 2020-08-03 MED ORDER — VANCOMYCIN HCL 125 MG PO CAPS
125.0000 mg | ORAL_CAPSULE | Freq: Four times a day (QID) | ORAL | 0 refills | Status: DC
  Filled 2020-08-03: qty 20, 5d supply, fill #0
  Filled 2020-08-03: qty 36, 9d supply, fill #0

## 2020-08-03 MED ORDER — VANCOMYCIN 50 MG/ML ORAL SOLUTION
125.0000 mg | Freq: Four times a day (QID) | ORAL | 0 refills | Status: DC
  Filled 2020-08-03: qty 150, 15d supply, fill #0

## 2020-08-03 NOTE — Telephone Encounter (Signed)
Lynn West called with c/o watery liquid diarrhea multiple episodes, had to wake up middle of night few times for past 2 days. She is also having abdominal cramping. No fever. No sick contacts or food poisoning. She submitted GI pathogen panel, await results.  Will send Rx for oral vancomycin 125mg  q6h X14 days given long weekend and possible delay in results. Advised her to start taking if continues to have persistent diarrhea.  Damaris Hippo , MD [phone]

## 2020-08-03 NOTE — Addendum Note (Signed)
Addended by: Mauri Pole on: 08/03/2020 09:49 AM   Modules accepted: Orders

## 2020-08-07 ENCOUNTER — Telehealth: Payer: Self-pay

## 2020-08-07 ENCOUNTER — Other Ambulatory Visit (HOSPITAL_COMMUNITY): Payer: Self-pay

## 2020-08-07 LAB — GI PROFILE, STOOL, PCR
Adenovirus F 40/41: DETECTED — AB
Astrovirus: NOT DETECTED
C difficile toxin A/B: NOT DETECTED
Campylobacter: NOT DETECTED
Cryptosporidium: NOT DETECTED
Cyclospora cayetanensis: NOT DETECTED
Entamoeba histolytica: NOT DETECTED
Enteroaggregative E coli: NOT DETECTED
Enteropathogenic E coli: NOT DETECTED
Enterotoxigenic E coli: NOT DETECTED
Giardia lamblia: NOT DETECTED
Norovirus GI/GII: NOT DETECTED
Plesiomonas shigelloides: NOT DETECTED
Rotavirus A: NOT DETECTED
Salmonella: NOT DETECTED
Sapovirus: NOT DETECTED
Shiga-toxin-producing E coli: NOT DETECTED
Shigella/Enteroinvasive E coli: NOT DETECTED
Vibrio cholerae: NOT DETECTED
Vibrio: NOT DETECTED
Yersinia enterocolitica: NOT DETECTED

## 2020-08-07 NOTE — Telephone Encounter (Signed)
DoD Dr. Silverio Decamp, I received a call from [facility] with [employer] [phone], option 1).  Adenovirus F 40/41 was detected on GI profile stool study. Talma states that report should be available in epic soon. If not we will need to call them back and request a copy. Thanks

## 2020-08-07 NOTE — Telephone Encounter (Addendum)
I have sent a message to Lynn West.   Dr Silverio Decamp had given her antibiotics (vancomycin)  to start if needed. Patient confirms she is taking Vancomycin. She is feeling better but "my stomach is not 100%."

## 2020-08-07 NOTE — Telephone Encounter (Signed)
Dr. Silverio Decamp, I received a call from [facility] with [employer] [phone], option 1).  Adenovirus F 40/41 was detected on GI profile stool study. Talma states that report should be available in epic soon. If not we will need to call them back and request a copy. Thanks

## 2020-08-08 ENCOUNTER — Other Ambulatory Visit (HOSPITAL_COMMUNITY): Payer: Self-pay

## 2020-08-20 ENCOUNTER — Other Ambulatory Visit (HOSPITAL_COMMUNITY): Payer: Self-pay

## 2020-08-20 DIAGNOSIS — Z30432 Encounter for removal of intrauterine contraceptive device: Secondary | ICD-10-CM | POA: Diagnosis not present

## 2020-08-20 DIAGNOSIS — Z124 Encounter for screening for malignant neoplasm of cervix: Secondary | ICD-10-CM | POA: Diagnosis not present

## 2020-08-20 DIAGNOSIS — Z309 Encounter for contraceptive management, unspecified: Secondary | ICD-10-CM | POA: Diagnosis not present

## 2020-08-20 DIAGNOSIS — Z113 Encounter for screening for infections with a predominantly sexual mode of transmission: Secondary | ICD-10-CM | POA: Diagnosis not present

## 2020-08-20 DIAGNOSIS — Z01411 Encounter for gynecological examination (general) (routine) with abnormal findings: Secondary | ICD-10-CM | POA: Diagnosis not present

## 2020-08-20 DIAGNOSIS — Z6828 Body mass index (BMI) 28.0-28.9, adult: Secondary | ICD-10-CM | POA: Diagnosis not present

## 2020-08-20 DIAGNOSIS — Z01419 Encounter for gynecological examination (general) (routine) without abnormal findings: Secondary | ICD-10-CM | POA: Diagnosis not present

## 2020-08-20 MED ORDER — DROSPIRENONE-ETHINYL ESTRADIOL 3-0.02 MG PO TABS
1.0000 | ORAL_TABLET | Freq: Every day | ORAL | 4 refills | Status: AC
  Filled 2020-08-20: qty 84, 84d supply, fill #0
  Filled 2020-11-13: qty 84, 84d supply, fill #1
  Filled 2021-01-28: qty 84, 84d supply, fill #2
  Filled 2021-04-11: qty 84, 84d supply, fill #3
  Filled 2021-06-24 – 2021-06-25 (×2): qty 84, 84d supply, fill #4

## 2020-08-21 ENCOUNTER — Other Ambulatory Visit (HOSPITAL_COMMUNITY): Payer: Self-pay

## 2020-08-21 MED FILL — Bupropion HCl Tab ER 24HR 150 MG: ORAL | 90 days supply | Qty: 90 | Fill #0 | Status: AC

## 2020-08-30 ENCOUNTER — Telehealth: Payer: Self-pay | Admitting: *Deleted

## 2020-08-30 NOTE — Telephone Encounter (Signed)
Waiting on [facility] to resubmit the claim with the new code [place].8

## 2020-09-12 ENCOUNTER — Encounter: Payer: Self-pay | Admitting: Internal Medicine

## 2020-09-13 ENCOUNTER — Other Ambulatory Visit (HOSPITAL_COMMUNITY): Payer: Self-pay

## 2020-09-13 MED ORDER — BENZOYL PEROXIDE-ERYTHROMYCIN 5-3 % EX GEL
Freq: Two times a day (BID) | CUTANEOUS | 11 refills | Status: DC
  Filled 2020-09-13: qty 23.3, 30d supply, fill #0

## 2020-09-14 NOTE — Telephone Encounter (Signed)
Need to call [facility] again , insurance has not received a new dx code

## 2020-09-14 NOTE — Telephone Encounter (Signed)
Billing Department Account bill [phone]  Mariea Clonts from [employer] requires medical records for new dx code   Dr Silverio Decamp can you addend the note to cover code A04.8 for Lynn West when you get back  Notes will need to be faxed to [phone]

## 2020-09-14 NOTE — Telephone Encounter (Signed)
[facility] Acct # [id number]   [phone]

## 2020-09-21 ENCOUNTER — Other Ambulatory Visit (HOSPITAL_COMMUNITY): Payer: Self-pay

## 2020-09-21 MED FILL — Pantoprazole Sodium EC Tab 40 MG (Base Equiv): ORAL | 90 days supply | Qty: 90 | Fill #1 | Status: AC

## 2020-09-28 NOTE — Progress Notes (Signed)
Lynn West called with c/o watery liquid diarrhea multiple episodes, had to wake up middle of night few times for past 2 days. She is also having abdominal cramping. No fever. No sick contacts or food poisoning. She submitted GI pathogen panel, await results. Will send Rx for oral vancomycin '125mg'$  q6h X14 days given long weekend and possible delay in results. Advised her to start taking if continues to have persistent diarrhea.   Lynn West , MD [phone]           Electronically signed by Mauri Pole, MD at 08/03/2020  9:07 AM

## 2020-09-28 NOTE — Telephone Encounter (Signed)
I did an addendum with new ICD code, thanks

## 2020-10-01 NOTE — Telephone Encounter (Signed)
Which encounter has the addendum with the new dx code I cant find it

## 2020-10-01 NOTE — Telephone Encounter (Signed)
Lab order

## 2020-10-01 NOTE — Telephone Encounter (Signed)
Faxed documentation to the fax number provided

## 2020-10-25 NOTE — Telephone Encounter (Signed)
Dr Llana Aliment said we need to submit another code Z03.89 So this code as well will have to be added to her other codes for me to fax back to them again

## 2020-10-26 NOTE — Telephone Encounter (Signed)
done

## 2020-11-01 NOTE — Telephone Encounter (Signed)
Faxed 2nd new code to [place]

## 2020-11-13 ENCOUNTER — Other Ambulatory Visit (HOSPITAL_COMMUNITY): Payer: Self-pay

## 2020-11-19 ENCOUNTER — Other Ambulatory Visit (HOSPITAL_COMMUNITY): Payer: Self-pay

## 2020-11-19 MED FILL — Bupropion HCl Tab ER 24HR 150 MG: ORAL | 90 days supply | Qty: 90 | Fill #1 | Status: AC

## 2020-12-22 ENCOUNTER — Other Ambulatory Visit (HOSPITAL_COMMUNITY): Payer: Self-pay

## 2020-12-22 MED FILL — Pantoprazole Sodium EC Tab 40 MG (Base Equiv): ORAL | 90 days supply | Qty: 90 | Fill #2 | Status: AC

## 2021-01-08 IMAGING — MR MR BREAST BILAT WO/W CM
8 of 12 series · 33 of 48 positions shown · IV contrast (gadavist)
Comparison: Previous mammograms at [facility], the most
recent dated 11/16/2018.

CLINICAL DATA: Pain in the upper outer quadrant and areola of the
right breast for the past 6 months. No suspicious abnormality seen
on a bilateral diagnostic mammogram obtained at [facility] on
11/16/2018. Paternal grandmother diagnosed with breast cancer in her
60s.

LABS:  None obtained on site today.
EXAM:
BILATERAL BREAST MRI WITH AND WITHOUT CONTRAST
TECHNIQUE: Multiplanar, multisequence MR images of both breasts were obtained
prior to and following the intravenous administration of 8 ml of
Gadavist

[Series 3: t2_tirm_tra ipat (a-p) · axial · 3.0mm · 0.70mm/px · 1 of 60 slices shown]
[im 1/60]
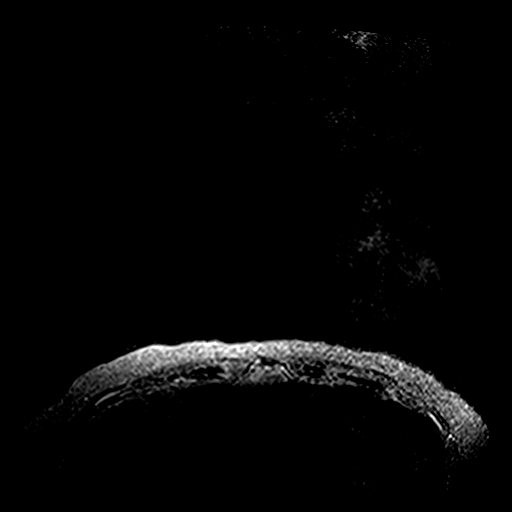

[Series 4: fl3d pre-cm no · axial · non-contrast · 1.2mm · 0.94mm/px · z∈[-63,+109]mm · 5 of 144 slices shown]
[im 1/144]
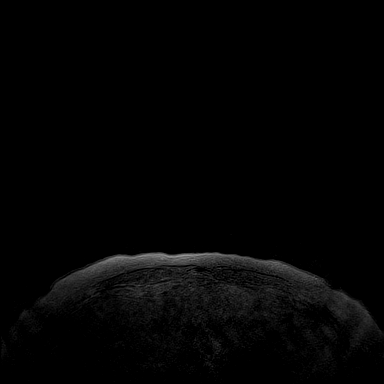
[im 36/144]
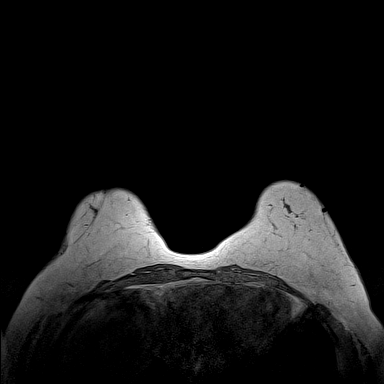
[im 72/144]
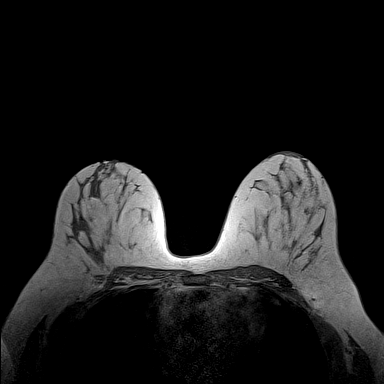
[im 108/144]
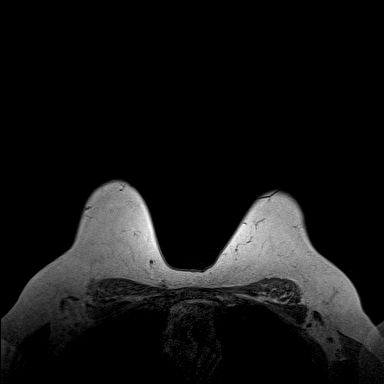
[im 144/144]
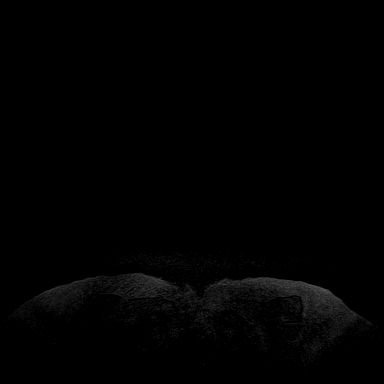

[Series 5: fl3d pre-cm · axial · non-contrast · 1.2mm · 0.94mm/px · z∈[-63,+109]mm · 5 of 144 slices shown]
[im 1/144]
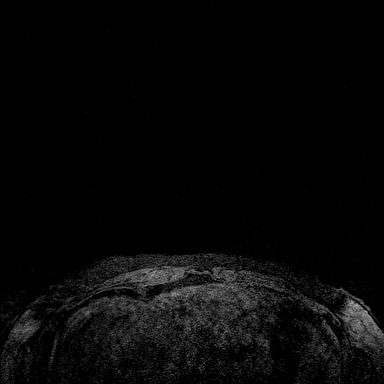
[im 36/144]
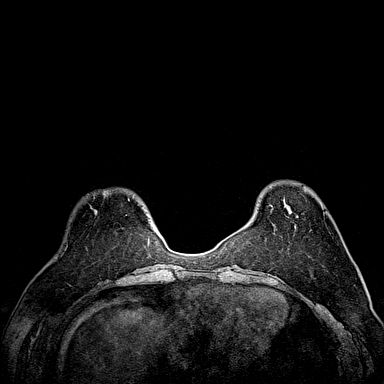
[im 72/144]
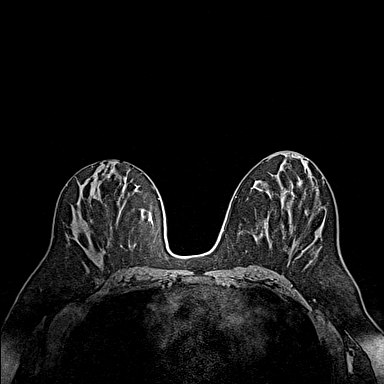
[im 108/144]
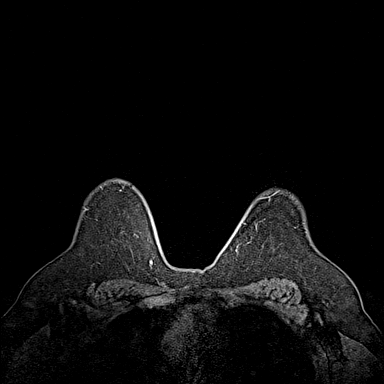
[im 144/144]
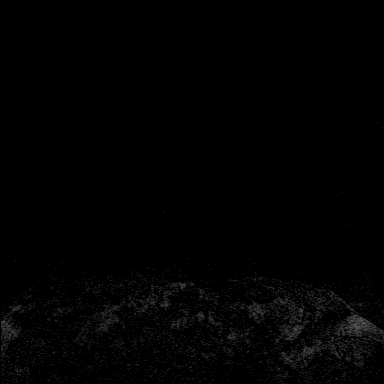

[Series 6: fl3d post-cm 20 · axial · 1.2mm · 0.94mm/px · z∈[-63,+109]mm · 5 of 144 slices shown (1 of 3)]
[im 1/144]
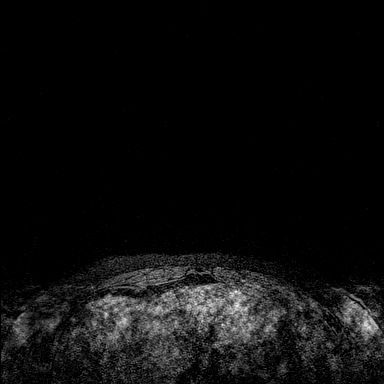
[im 36/144]
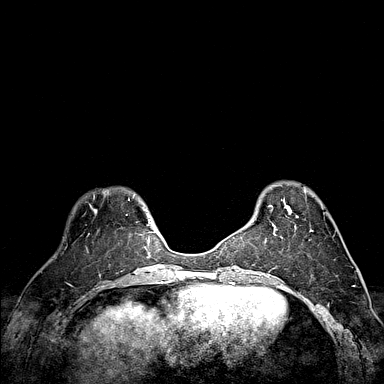
[im 72/144]
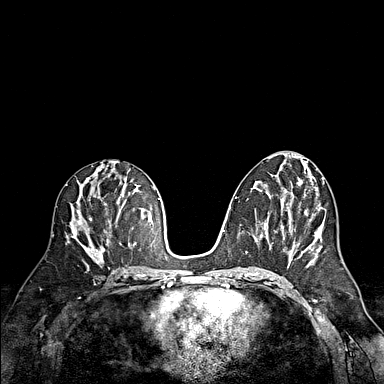
[im 108/144]
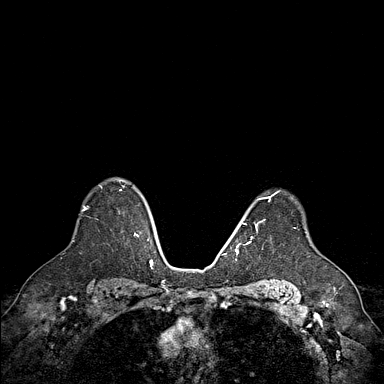
[im 144/144]
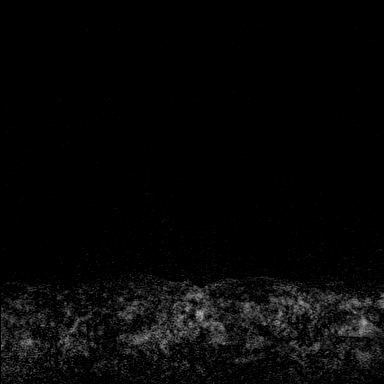

[Series 7: fl3d post-cm 20 · axial · 1.2mm · 0.94mm/px · z∈[-63,+109]mm · 5 of 144 slices shown (2 of 3)]
[im 1/144]
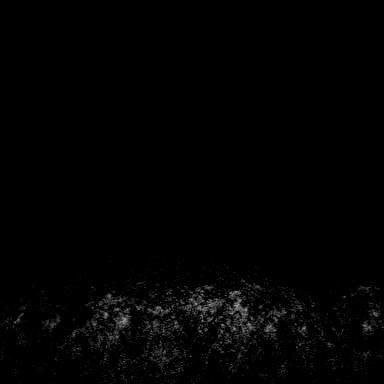
[im 36/144]
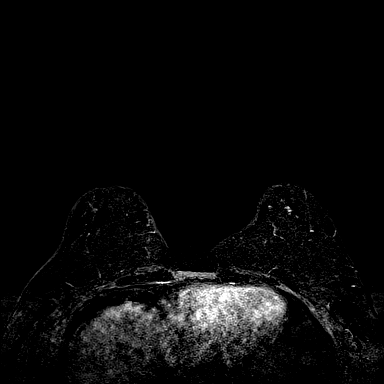
[im 72/144]
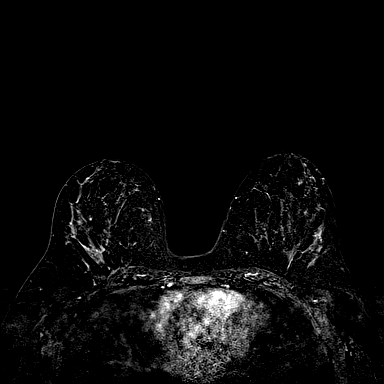
[im 108/144]
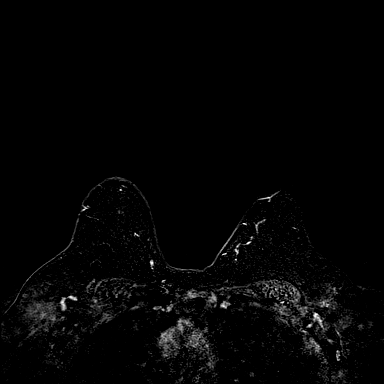
[im 144/144]
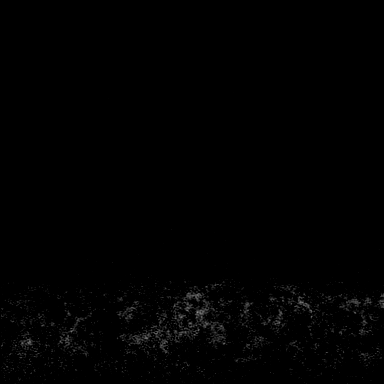

[Series 8: fl3d post-cm 20 · axial · 172.8mm · 0.94mm/px · 1 of 1 slices shown (3 of 3)]
[im 1/1]
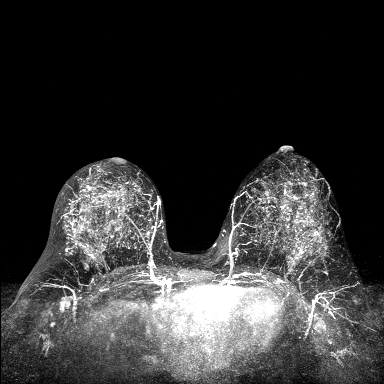

[Series 9: fl3d post-cm 3min · axial · 1.2mm · 0.94mm/px · z∈[-63,+109]mm · 6 of 144 slices shown]
[im 1/144]
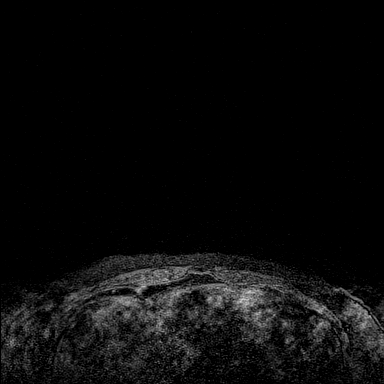
[im 29/144]
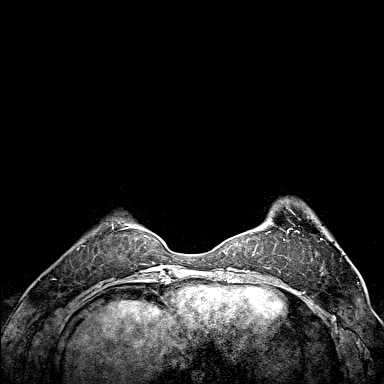
[im 58/144]
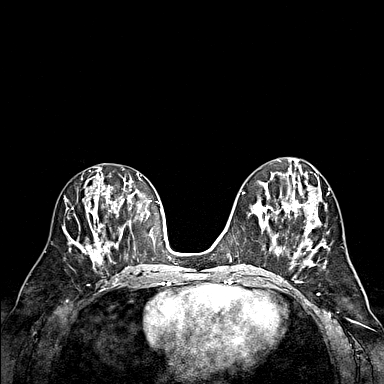
[im 86/144]
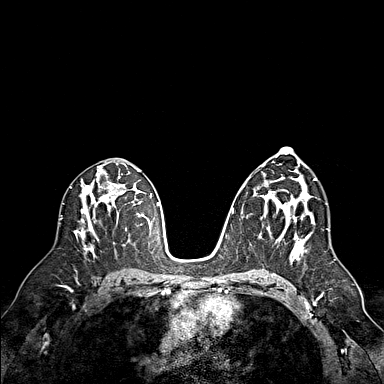
[im 115/144]
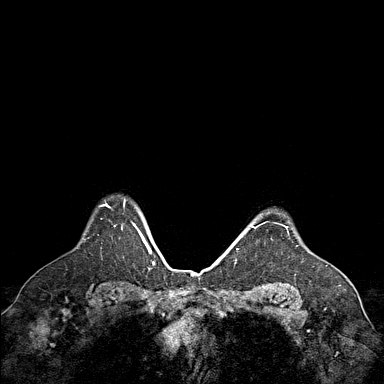
[im 144/144]
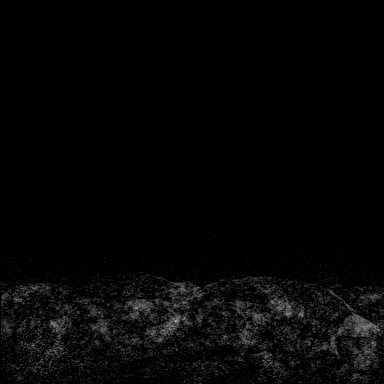

[Series 10: fl3d post-cm 3min_sub · axial · 1.2mm · 0.94mm/px · z∈[-63,+74]mm · 5 of 144 slices shown]
[im 1/144]
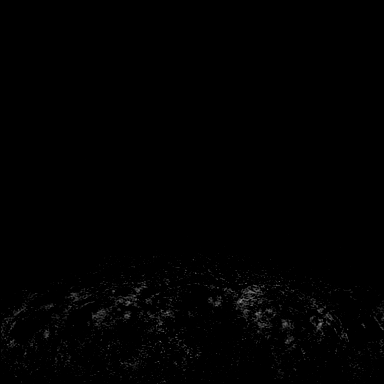
[im 29/144]
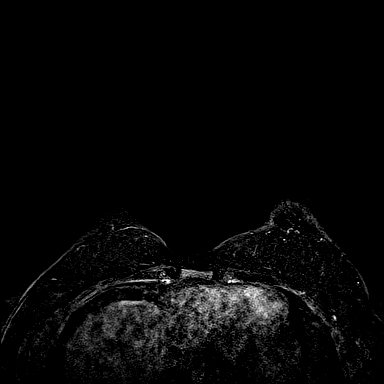
[im 58/144]
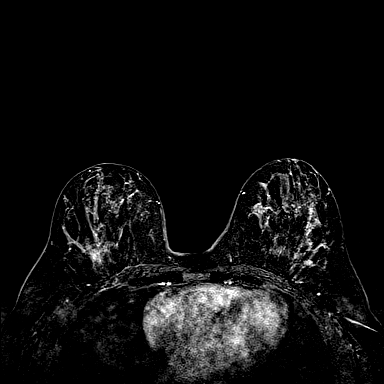
[im 86/144]
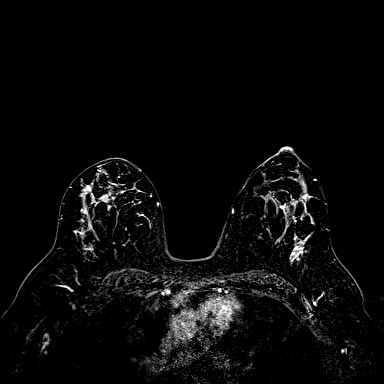
[im 115/144]
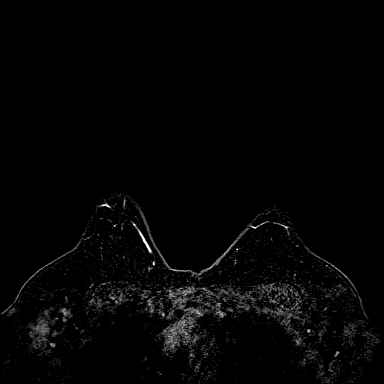

[33 of 48 positions shown; findings below may reference images not displayed]

Three-dimensional MR images were rendered by post-processing of the
original MR data on an independent workstation. The
three-dimensional MR images were interpreted, and findings are
reported in the following complete MRI report for this study. Three
dimensional images were evaluated at the independent [employer]
workstation
FINDINGS: Breast composition: c. Heterogeneous fibroglandular tissue.

Background parenchymal enhancement: Moderate.

Right breast: No mass or abnormal enhancement.

Left breast: No mass or abnormal enhancement.

Lymph nodes: No abnormal appearing lymph nodes.

Ancillary findings:  None.
IMPRESSION: Normal examination.  No evidence of malignancy.

RECOMMENDATION:
Annual screening mammography beginning at age 40.

BI-RADS CATEGORY  1: Negative.

## 2021-01-28 ENCOUNTER — Other Ambulatory Visit (HOSPITAL_COMMUNITY): Payer: Self-pay

## 2021-02-19 ENCOUNTER — Other Ambulatory Visit: Payer: Self-pay | Admitting: Internal Medicine

## 2021-02-21 ENCOUNTER — Encounter: Payer: Self-pay | Admitting: Internal Medicine

## 2021-02-21 ENCOUNTER — Other Ambulatory Visit (HOSPITAL_COMMUNITY): Payer: Self-pay

## 2021-02-22 ENCOUNTER — Other Ambulatory Visit: Payer: Self-pay

## 2021-02-22 ENCOUNTER — Other Ambulatory Visit: Payer: Self-pay | Admitting: Internal Medicine

## 2021-02-22 ENCOUNTER — Other Ambulatory Visit (HOSPITAL_COMMUNITY): Payer: Self-pay

## 2021-02-22 MED ORDER — BUPROPION HCL ER (XL) 150 MG PO TB24
ORAL_TABLET | Freq: Every day | ORAL | 3 refills | Status: DC
  Filled 2021-02-22: qty 90, 90d supply, fill #0

## 2021-03-08 ENCOUNTER — Telehealth: Payer: 59 | Admitting: Internal Medicine

## 2021-03-13 ENCOUNTER — Telehealth (INDEPENDENT_AMBULATORY_CARE_PROVIDER_SITE_OTHER): Payer: 59 | Admitting: Internal Medicine

## 2021-03-13 ENCOUNTER — Encounter: Payer: Self-pay | Admitting: Internal Medicine

## 2021-03-13 DIAGNOSIS — F419 Anxiety disorder, unspecified: Secondary | ICD-10-CM | POA: Diagnosis not present

## 2021-03-13 DIAGNOSIS — K219 Gastro-esophageal reflux disease without esophagitis: Secondary | ICD-10-CM | POA: Diagnosis not present

## 2021-03-13 DIAGNOSIS — D519 Vitamin B12 deficiency anemia, unspecified: Secondary | ICD-10-CM

## 2021-03-13 NOTE — Progress Notes (Signed)
Virtual Visit via Video Note  I connected with Lynn West on 03/13/21 at  9:00 AM EST by a video enabled telemedicine application and verified that I am speaking with the correct person using two identifiers.  The patient and the provider were at separate locations throughout the entire encounter. Patient location: home, Provider location: work   I discussed the limitations of evaluation and management by telemedicine and the availability of in person appointments. The patient expressed understanding and agreed to proceed. The patient and the provider were the only parties present for the visit unless noted in HPI below.  History of Present Illness: The patient is a 36 y.o. female with visit for follow up.   Observations/Objective: Appearance: normal, breathing appears normal, casual grooming, A and O times 3  Assessment and Plan: See problem oriented charting  Follow Up Instructions: increase wellbutrin to 300 mg daily and consider ozempic or wegovy if weight loss not improved  I discussed the assessment and treatment plan with the patient. The patient was provided an opportunity to ask questions and all were answered. The patient agreed with the plan and demonstrated an understanding of the instructions.   The patient was advised to call back or seek an in-person evaluation if the symptoms worsen or if the condition fails to improve as anticipated.  Hoyt Koch, MD

## 2021-03-13 NOTE — Assessment & Plan Note (Signed)
Taking protonix with decent control of symptoms.

## 2021-03-13 NOTE — Assessment & Plan Note (Signed)
Still taking oral B12.

## 2021-03-13 NOTE — Assessment & Plan Note (Signed)
Not ideally controlled lately. Will increase wellbutrin to 300 mg daily (2 pills). She has enough on hand for about 1 month and then we will have her contact us and change rx if doing well. If not we can reduce back to 150 mg daily or change agents.

## 2021-03-18 ENCOUNTER — Other Ambulatory Visit (HOSPITAL_COMMUNITY): Payer: Self-pay

## 2021-03-18 DIAGNOSIS — B360 Pityriasis versicolor: Secondary | ICD-10-CM | POA: Diagnosis not present

## 2021-03-18 DIAGNOSIS — L821 Other seborrheic keratosis: Secondary | ICD-10-CM | POA: Diagnosis not present

## 2021-03-18 DIAGNOSIS — L814 Other melanin hyperpigmentation: Secondary | ICD-10-CM | POA: Diagnosis not present

## 2021-03-18 DIAGNOSIS — D225 Melanocytic nevi of trunk: Secondary | ICD-10-CM | POA: Diagnosis not present

## 2021-03-18 DIAGNOSIS — Z872 Personal history of diseases of the skin and subcutaneous tissue: Secondary | ICD-10-CM | POA: Diagnosis not present

## 2021-03-18 MED ORDER — KETOCONAZOLE 2 % EX SHAM
MEDICATED_SHAMPOO | CUTANEOUS | 11 refills | Status: DC
  Filled 2021-03-18: qty 120, 30d supply, fill #0

## 2021-03-18 MED ORDER — KETOCONAZOLE 2 % EX CREA
TOPICAL_CREAM | CUTANEOUS | 3 refills | Status: DC
  Filled 2021-03-18: qty 60, 21d supply, fill #0

## 2021-03-19 ENCOUNTER — Other Ambulatory Visit (HOSPITAL_COMMUNITY): Payer: Self-pay

## 2021-03-21 ENCOUNTER — Other Ambulatory Visit (HOSPITAL_COMMUNITY): Payer: Self-pay

## 2021-03-21 ENCOUNTER — Other Ambulatory Visit: Payer: Self-pay | Admitting: Internal Medicine

## 2021-03-21 MED ORDER — PANTOPRAZOLE SODIUM 40 MG PO TBEC
DELAYED_RELEASE_TABLET | Freq: Every day | ORAL | 3 refills | Status: DC
  Filled 2021-03-21: qty 90, 90d supply, fill #0
  Filled 2021-07-02: qty 90, 90d supply, fill #1
  Filled 2021-09-18: qty 90, 90d supply, fill #2
  Filled 2021-12-31: qty 90, 90d supply, fill #3

## 2021-03-22 ENCOUNTER — Other Ambulatory Visit (HOSPITAL_COMMUNITY): Payer: Self-pay

## 2021-04-03 ENCOUNTER — Encounter: Payer: Self-pay | Admitting: Internal Medicine

## 2021-04-03 DIAGNOSIS — D519 Vitamin B12 deficiency anemia, unspecified: Secondary | ICD-10-CM

## 2021-04-03 DIAGNOSIS — Z0000 Encounter for general adult medical examination without abnormal findings: Secondary | ICD-10-CM

## 2021-04-05 ENCOUNTER — Other Ambulatory Visit (HOSPITAL_COMMUNITY): Payer: Self-pay

## 2021-04-05 MED ORDER — BUPROPION HCL ER (XL) 300 MG PO TB24
300.0000 mg | ORAL_TABLET | Freq: Every day | ORAL | 3 refills | Status: DC
  Filled 2021-04-05: qty 90, 90d supply, fill #0

## 2021-04-11 ENCOUNTER — Other Ambulatory Visit (HOSPITAL_COMMUNITY): Payer: Self-pay

## 2021-06-24 ENCOUNTER — Other Ambulatory Visit (HOSPITAL_COMMUNITY): Payer: Self-pay

## 2021-06-24 MED ORDER — DROSPIRENONE-ETHINYL ESTRADIOL 3-0.02 MG PO TABS
ORAL_TABLET | ORAL | 0 refills | Status: DC
  Filled 2021-06-24: qty 84, 72d supply, fill #0
  Filled 2021-06-25: qty 112, 72d supply, fill #0

## 2021-06-25 ENCOUNTER — Other Ambulatory Visit (HOSPITAL_COMMUNITY): Payer: Self-pay

## 2021-06-28 ENCOUNTER — Other Ambulatory Visit (HOSPITAL_COMMUNITY): Payer: Self-pay

## 2021-07-02 ENCOUNTER — Other Ambulatory Visit (HOSPITAL_COMMUNITY): Payer: Self-pay

## 2021-07-12 ENCOUNTER — Encounter: Payer: Self-pay | Admitting: Internal Medicine

## 2021-07-12 ENCOUNTER — Other Ambulatory Visit (HOSPITAL_COMMUNITY): Payer: Self-pay

## 2021-07-12 MED ORDER — SEMAGLUTIDE-WEIGHT MANAGEMENT 1 MG/0.5ML ~~LOC~~ SOAJ
1.0000 mg | SUBCUTANEOUS | 0 refills | Status: AC
  Filled 2021-07-12: qty 2, 28d supply, fill #0

## 2021-07-12 MED ORDER — SEMAGLUTIDE-WEIGHT MANAGEMENT 0.5 MG/0.5ML ~~LOC~~ SOAJ
0.5000 mg | SUBCUTANEOUS | 0 refills | Status: AC
  Filled 2021-07-12: qty 2, 28d supply, fill #0

## 2021-07-12 MED ORDER — SEMAGLUTIDE-WEIGHT MANAGEMENT 0.25 MG/0.5ML ~~LOC~~ SOAJ
0.2500 mg | SUBCUTANEOUS | 0 refills | Status: AC
  Filled 2021-07-12 – 2021-07-19 (×2): qty 2, 28d supply, fill #0

## 2021-07-12 MED ORDER — SEMAGLUTIDE-WEIGHT MANAGEMENT 1.7 MG/0.75ML ~~LOC~~ SOAJ
1.7000 mg | SUBCUTANEOUS | 0 refills | Status: AC
  Filled 2021-07-12: qty 3, 28d supply, fill #0

## 2021-07-12 MED ORDER — SEMAGLUTIDE-WEIGHT MANAGEMENT 2.4 MG/0.75ML ~~LOC~~ SOAJ
2.4000 mg | SUBCUTANEOUS | 0 refills | Status: AC
  Filled 2021-07-12: qty 3, 28d supply, fill #0

## 2021-07-18 ENCOUNTER — Other Ambulatory Visit (HOSPITAL_COMMUNITY): Payer: Self-pay

## 2021-07-19 ENCOUNTER — Other Ambulatory Visit (HOSPITAL_COMMUNITY): Payer: Self-pay

## 2021-08-09 ENCOUNTER — Other Ambulatory Visit (HOSPITAL_COMMUNITY): Payer: Self-pay

## 2021-08-17 ENCOUNTER — Other Ambulatory Visit (HOSPITAL_COMMUNITY): Payer: Self-pay

## 2021-09-02 ENCOUNTER — Other Ambulatory Visit (HOSPITAL_COMMUNITY): Payer: Self-pay

## 2021-09-02 DIAGNOSIS — E669 Obesity, unspecified: Secondary | ICD-10-CM | POA: Diagnosis not present

## 2021-09-02 DIAGNOSIS — Z01419 Encounter for gynecological examination (general) (routine) without abnormal findings: Secondary | ICD-10-CM | POA: Diagnosis not present

## 2021-09-02 DIAGNOSIS — Z309 Encounter for contraceptive management, unspecified: Secondary | ICD-10-CM | POA: Diagnosis not present

## 2021-09-02 DIAGNOSIS — Z6829 Body mass index (BMI) 29.0-29.9, adult: Secondary | ICD-10-CM | POA: Diagnosis not present

## 2021-09-02 MED ORDER — DROSPIRENONE-ETHINYL ESTRADIOL 3-0.02 MG PO TABS
ORAL_TABLET | ORAL | 4 refills | Status: DC
  Filled 2021-09-02: qty 112, 84d supply, fill #0
  Filled 2021-12-31: qty 112, 84d supply, fill #1
  Filled 2022-03-21 – 2022-04-04 (×2): qty 112, 84d supply, fill #2
  Filled 2022-07-15: qty 112, 84d supply, fill #3

## 2021-09-02 MED ORDER — WEGOVY 0.25 MG/0.5ML ~~LOC~~ SOAJ
SUBCUTANEOUS | 1 refills | Status: DC
  Filled 2021-09-02 – 2022-04-17 (×3): qty 2, 28d supply, fill #0

## 2021-09-06 ENCOUNTER — Other Ambulatory Visit (HOSPITAL_COMMUNITY): Payer: Self-pay

## 2021-09-18 ENCOUNTER — Other Ambulatory Visit (HOSPITAL_COMMUNITY): Payer: Self-pay

## 2021-10-02 ENCOUNTER — Other Ambulatory Visit (HOSPITAL_COMMUNITY): Payer: Self-pay

## 2021-10-11 ENCOUNTER — Other Ambulatory Visit (HOSPITAL_COMMUNITY): Payer: Self-pay

## 2021-10-15 ENCOUNTER — Other Ambulatory Visit (INDEPENDENT_AMBULATORY_CARE_PROVIDER_SITE_OTHER): Payer: 59

## 2021-10-15 DIAGNOSIS — D519 Vitamin B12 deficiency anemia, unspecified: Secondary | ICD-10-CM

## 2021-10-15 DIAGNOSIS — Z0000 Encounter for general adult medical examination without abnormal findings: Secondary | ICD-10-CM

## 2021-10-15 LAB — COMPREHENSIVE METABOLIC PANEL
ALT: 16 U/L (ref 0–35)
AST: 17 U/L (ref 0–37)
Albumin: 4.3 g/dL (ref 3.5–5.2)
Alkaline Phosphatase: 37 U/L — ABNORMAL LOW (ref 39–117)
BUN: 19 mg/dL (ref 6–23)
CO2: 24 mEq/L (ref 19–32)
Calcium: 9.1 mg/dL (ref 8.4–10.5)
Chloride: 104 mEq/L (ref 96–112)
Creatinine, Ser: 0.74 mg/dL (ref 0.40–1.20)
GFR: 103.15 mL/min (ref >60.00)
Glucose, Bld: 90 mg/dL (ref 70–99)
Potassium: 4.1 mEq/L (ref 3.5–5.1)
Sodium: 138 mEq/L (ref 135–145)
Total Bilirubin: 0.4 mg/dL (ref 0.2–1.2)
Total Protein: 7.1 g/dL (ref 6.0–8.3)

## 2021-10-15 LAB — LIPID PANEL
Cholesterol: 199 mg/dL (ref 0–200)
HDL: 55.4 mg/dL (ref >39.00)
LDL Cholesterol: 116 mg/dL — ABNORMAL HIGH (ref 0–99)
NonHDL: 143.57
Total CHOL/HDL Ratio: 4
Triglycerides: 140 mg/dL (ref 0.0–149.0)
VLDL: 28 mg/dL (ref 0.0–40.0)

## 2021-10-15 LAB — TSH: TSH: 0.64 u[IU]/mL (ref 0.35–5.50)

## 2021-10-15 LAB — VITAMIN B12: Vitamin B-12: 362 pg/mL (ref 211–911)

## 2021-10-15 LAB — CBC
HCT: 37.5 % (ref 36.0–46.0)
Hemoglobin: 12.5 g/dL (ref 12.0–15.0)
MCHC: 33.3 g/dL (ref 30.0–36.0)
MCV: 87.7 fl (ref 78.0–100.0)
Platelets: 177 10*3/uL (ref 150.0–400.0)
RBC: 4.28 Mil/uL (ref 3.87–5.11)
RDW: 13.5 % (ref 11.5–15.5)
WBC: 6.4 10*3/uL (ref 4.0–10.5)

## 2021-12-24 ENCOUNTER — Other Ambulatory Visit (HOSPITAL_COMMUNITY): Payer: Self-pay

## 2021-12-24 ENCOUNTER — Telehealth: Payer: 59 | Admitting: Emergency Medicine

## 2021-12-24 DIAGNOSIS — B9689 Other specified bacterial agents as the cause of diseases classified elsewhere: Secondary | ICD-10-CM | POA: Diagnosis not present

## 2021-12-24 DIAGNOSIS — J019 Acute sinusitis, unspecified: Secondary | ICD-10-CM

## 2021-12-24 MED ORDER — AMOXICILLIN-POT CLAVULANATE 875-125 MG PO TABS
1.0000 | ORAL_TABLET | Freq: Two times a day (BID) | ORAL | 0 refills | Status: DC
  Filled 2021-12-24: qty 14, 7d supply, fill #0

## 2021-12-24 NOTE — Progress Notes (Signed)
E-Visit for Sinus Problems  We are sorry that you are not feeling well.  Here is how we plan to help!  Based on what you have shared with me it looks like you have sinusitis.  Sinusitis is inflammation and infection in the sinus cavities of the head.  Based on your presentation I believe you most likely have Acute Bacterial Sinusitis.  This is an infection caused by bacteria and is treated with antibiotics. I have prescribed Augmentin 875mg/125mg one tablet twice daily with food, for 7 days.   You may use an oral decongestant such as Mucinex D or if you have glaucoma or high blood pressure use plain Mucinex.   Saline nasal spray help and can safely be used as often as needed for congestion.  Try using saline irrigation, such as with a neti pot, several times a day while you are sick. Many neti pots come with salt packets premeasured to use to make saline. If you use your own salt, make sure it is kosher salt or sea salt (don't use table salt as it has iodine in it and you don't need that in your nose). Use distilled water to make saline. If you mix your own saline using your own salt, the recipe is 1/4 teaspoon salt in 1 cup warm water. Using saline irrigation can help prevent and treat sinus infections.   If you develop worsening sinus pain, fever or notice severe headache and vision changes, or if symptoms are not better after completion of antibiotic, please schedule an appointment with a health care provider.    Sinus infections are not as easily transmitted as other respiratory infection, however we still recommend that you avoid close contact with loved ones, especially the very young and elderly.  Remember to wash your hands thoroughly throughout the day as this is the number one way to prevent the spread of infection!  Home Care: Only take medications as instructed by your medical team. Complete the entire course of an antibiotic. Do not take these medications with alcohol. A steam or  ultrasonic humidifier can help congestion.  You can place a towel over your head and breathe in the steam from hot water coming from a faucet. Avoid close contacts especially the very young and the elderly. Cover your mouth when you cough or sneeze. Always remember to wash your hands.  Get Help Right Away If: You develop worsening fever or sinus pain. You develop a severe head ache or visual changes. Your symptoms persist after you have completed your treatment plan.  Make sure you Understand these instructions. Will watch your condition. Will get help right away if you are not doing well or get worse.  Thank you for choosing an e-visit.  Your e-visit answers were reviewed by a board certified advanced clinical practitioner to complete your personal care plan. Depending upon the condition, your plan could have included both over the counter or prescription medications.  Please review your pharmacy choice. Make sure the pharmacy is open so you can pick up prescription now. If there is a problem, you may contact your provider through MyChart messaging and have the prescription routed to another pharmacy.  Your safety is important to us. If you have drug allergies check your prescription carefully.   For the next 24 hours you can use MyChart to ask questions about today's visit, request a non-urgent call back, or ask for a work or school excuse. You will get an email in the next two days asking   about your experience. I hope that your e-visit has been valuable and will speed your recovery.  I have spent 5 minutes in review of e-visit questionnaire, review and updating patient chart, medical decision making and response to patient.   Isyss Espinal, PhD, FNP-BC   

## 2021-12-31 ENCOUNTER — Other Ambulatory Visit (HOSPITAL_COMMUNITY): Payer: Self-pay

## 2022-01-01 ENCOUNTER — Other Ambulatory Visit (HOSPITAL_COMMUNITY): Payer: Self-pay

## 2022-02-19 ENCOUNTER — Telehealth: Payer: Self-pay

## 2022-02-19 ENCOUNTER — Other Ambulatory Visit (HOSPITAL_COMMUNITY): Payer: Self-pay

## 2022-02-19 ENCOUNTER — Other Ambulatory Visit: Payer: Self-pay

## 2022-02-19 MED ORDER — PANTOPRAZOLE SODIUM 40 MG PO TBEC
40.0000 mg | DELAYED_RELEASE_TABLET | Freq: Two times a day (BID) | ORAL | 0 refills | Status: DC
  Filled 2022-02-19 – 2022-02-25 (×2): qty 180, 90d supply, fill #0

## 2022-02-19 NOTE — Telephone Encounter (Signed)
Patient needs a refill on her Protonix. She reports she has had to increase it to BID. Okay to refill at BID dosing?

## 2022-02-20 NOTE — Telephone Encounter (Signed)
Ok to refill Protonix BID

## 2022-02-25 ENCOUNTER — Other Ambulatory Visit (HOSPITAL_COMMUNITY): Payer: Self-pay

## 2022-03-24 ENCOUNTER — Other Ambulatory Visit: Payer: Self-pay

## 2022-04-01 ENCOUNTER — Other Ambulatory Visit (HOSPITAL_COMMUNITY): Payer: Self-pay

## 2022-04-04 ENCOUNTER — Other Ambulatory Visit (HOSPITAL_COMMUNITY): Payer: Self-pay

## 2022-04-07 ENCOUNTER — Other Ambulatory Visit (HOSPITAL_COMMUNITY): Payer: Self-pay

## 2022-04-08 ENCOUNTER — Other Ambulatory Visit (HOSPITAL_COMMUNITY): Payer: Self-pay

## 2022-04-11 ENCOUNTER — Other Ambulatory Visit (HOSPITAL_COMMUNITY): Payer: Self-pay

## 2022-04-11 MED ORDER — ZEPBOUND 2.5 MG/0.5ML ~~LOC~~ SOAJ
2.5000 mg | SUBCUTANEOUS | 0 refills | Status: DC
  Filled 2022-04-11 – 2022-04-21 (×5): qty 2, 28d supply, fill #0

## 2022-04-17 ENCOUNTER — Other Ambulatory Visit (HOSPITAL_COMMUNITY): Payer: Self-pay

## 2022-04-17 ENCOUNTER — Other Ambulatory Visit: Payer: Self-pay

## 2022-04-21 ENCOUNTER — Other Ambulatory Visit (HOSPITAL_COMMUNITY): Payer: Self-pay

## 2022-05-07 ENCOUNTER — Other Ambulatory Visit (HOSPITAL_COMMUNITY): Payer: Self-pay

## 2022-05-08 ENCOUNTER — Other Ambulatory Visit (HOSPITAL_COMMUNITY): Payer: Self-pay

## 2022-05-08 MED ORDER — ZEPBOUND 2.5 MG/0.5ML ~~LOC~~ SOAJ
SUBCUTANEOUS | 0 refills | Status: DC
  Filled 2022-05-08 – 2022-05-14 (×2): qty 2, 28d supply, fill #0

## 2022-05-14 ENCOUNTER — Other Ambulatory Visit (HOSPITAL_COMMUNITY): Payer: Self-pay

## 2022-05-15 ENCOUNTER — Other Ambulatory Visit (HOSPITAL_COMMUNITY): Payer: Self-pay

## 2022-05-19 ENCOUNTER — Other Ambulatory Visit (HOSPITAL_COMMUNITY): Payer: Self-pay

## 2022-05-19 ENCOUNTER — Other Ambulatory Visit: Payer: Self-pay

## 2022-05-19 MED ORDER — ZEPBOUND 5 MG/0.5ML ~~LOC~~ SOAJ
5.0000 mg | SUBCUTANEOUS | 0 refills | Status: DC
  Filled 2022-05-19 – 2022-05-27 (×2): qty 2, 28d supply, fill #0

## 2022-05-27 ENCOUNTER — Other Ambulatory Visit: Payer: Self-pay

## 2022-05-27 ENCOUNTER — Other Ambulatory Visit (HOSPITAL_COMMUNITY): Payer: Self-pay

## 2022-07-15 ENCOUNTER — Other Ambulatory Visit: Payer: Self-pay | Admitting: Gastroenterology

## 2022-07-16 ENCOUNTER — Other Ambulatory Visit (HOSPITAL_COMMUNITY): Payer: Self-pay

## 2022-07-16 MED ORDER — PANTOPRAZOLE SODIUM 40 MG PO TBEC
40.0000 mg | DELAYED_RELEASE_TABLET | Freq: Two times a day (BID) | ORAL | 0 refills | Status: DC
  Filled 2022-07-16: qty 180, 90d supply, fill #0

## 2022-08-01 DIAGNOSIS — B078 Other viral warts: Secondary | ICD-10-CM | POA: Diagnosis not present

## 2022-08-01 DIAGNOSIS — D225 Melanocytic nevi of trunk: Secondary | ICD-10-CM | POA: Diagnosis not present

## 2022-08-01 DIAGNOSIS — L821 Other seborrheic keratosis: Secondary | ICD-10-CM | POA: Diagnosis not present

## 2022-08-01 DIAGNOSIS — D485 Neoplasm of uncertain behavior of skin: Secondary | ICD-10-CM | POA: Diagnosis not present

## 2022-08-01 DIAGNOSIS — L814 Other melanin hyperpigmentation: Secondary | ICD-10-CM | POA: Diagnosis not present

## 2022-08-18 DIAGNOSIS — H52203 Unspecified astigmatism, bilateral: Secondary | ICD-10-CM | POA: Diagnosis not present

## 2022-08-18 DIAGNOSIS — H5213 Myopia, bilateral: Secondary | ICD-10-CM | POA: Diagnosis not present

## 2022-09-04 DIAGNOSIS — L989 Disorder of the skin and subcutaneous tissue, unspecified: Secondary | ICD-10-CM | POA: Diagnosis not present

## 2022-09-04 DIAGNOSIS — D485 Neoplasm of uncertain behavior of skin: Secondary | ICD-10-CM | POA: Diagnosis not present

## 2022-09-15 ENCOUNTER — Other Ambulatory Visit (HOSPITAL_COMMUNITY): Payer: Self-pay

## 2022-09-15 DIAGNOSIS — Z01419 Encounter for gynecological examination (general) (routine) without abnormal findings: Secondary | ICD-10-CM | POA: Diagnosis not present

## 2022-09-15 DIAGNOSIS — N760 Acute vaginitis: Secondary | ICD-10-CM | POA: Diagnosis not present

## 2022-09-15 MED ORDER — DROSPIRENONE-ETHINYL ESTRADIOL 3-0.02 MG PO TABS
1.0000 | ORAL_TABLET | Freq: Every day | ORAL | 4 refills | Status: DC
  Filled 2022-09-15: qty 112, 84d supply, fill #0
  Filled 2023-01-22: qty 112, 84d supply, fill #1
  Filled 2023-08-06: qty 112, 84d supply, fill #3

## 2022-09-15 MED ORDER — METRONIDAZOLE 0.75 % VA GEL
VAGINAL | 0 refills | Status: DC
  Filled 2022-09-15: qty 70, 5d supply, fill #0

## 2022-09-20 ENCOUNTER — Other Ambulatory Visit (HOSPITAL_COMMUNITY): Payer: Self-pay

## 2022-09-28 ENCOUNTER — Encounter: Payer: Self-pay | Admitting: Internal Medicine

## 2022-10-08 ENCOUNTER — Other Ambulatory Visit: Payer: Self-pay | Admitting: Gastroenterology

## 2022-10-09 ENCOUNTER — Other Ambulatory Visit (HOSPITAL_COMMUNITY): Payer: Self-pay

## 2022-10-09 MED ORDER — PANTOPRAZOLE SODIUM 40 MG PO TBEC
40.0000 mg | DELAYED_RELEASE_TABLET | Freq: Two times a day (BID) | ORAL | 0 refills | Status: DC
  Filled 2022-10-09: qty 180, 90d supply, fill #0

## 2022-10-10 ENCOUNTER — Other Ambulatory Visit (HOSPITAL_COMMUNITY): Payer: Self-pay

## 2022-10-15 DIAGNOSIS — S0502XA Injury of conjunctiva and corneal abrasion without foreign body, left eye, initial encounter: Secondary | ICD-10-CM | POA: Diagnosis not present

## 2022-10-27 ENCOUNTER — Telehealth (INDEPENDENT_AMBULATORY_CARE_PROVIDER_SITE_OTHER): Payer: 59 | Admitting: Internal Medicine

## 2022-10-27 ENCOUNTER — Encounter: Payer: Self-pay | Admitting: Internal Medicine

## 2022-10-27 DIAGNOSIS — E785 Hyperlipidemia, unspecified: Secondary | ICD-10-CM | POA: Diagnosis not present

## 2022-10-27 DIAGNOSIS — D519 Vitamin B12 deficiency anemia, unspecified: Secondary | ICD-10-CM | POA: Diagnosis not present

## 2022-10-27 DIAGNOSIS — R1011 Right upper quadrant pain: Secondary | ICD-10-CM

## 2022-10-27 DIAGNOSIS — K219 Gastro-esophageal reflux disease without esophagitis: Secondary | ICD-10-CM

## 2022-10-27 NOTE — Progress Notes (Unsigned)
Virtual Visit via Video Note  I connected with Lynn West on 10/27/22 at  3:40 PM EDT by a video enabled telemedicine application and verified that I am speaking with the correct person using two identifiers.  The patient and the provider were at separate locations throughout the entire encounter. Patient location: home, Provider location: work   I discussed the limitations of evaluation and management by telemedicine and the availability of in person appointments. The patient expressed understanding and agreed to proceed. The patient and the provider were the only parties present for the visit unless noted in HPI below.  History of Present Illness: The patient is a 37 y.o. {} with visit for {}. Started {}. Has {}. Denies {}. Overall it is {}. Has tried {}  Observations/Objective: Appearance: {}, breathing appears {}, {} grooming, abdomen {} appear distended, throat {}, memory {}, mental status is {}  Assessment and Plan: See problem oriented charting  Follow Up Instructions: {}  I discussed the assessment and treatment plan with the patient. The patient was provided an opportunity to ask questions and all were answered. The patient agreed with the plan and demonstrated an understanding of the instructions.   The patient was advised to call back or seek an in-person evaluation if the symptoms worsen or if the condition fails to improve as anticipated.  Myrlene Broker, MD

## 2022-10-28 DIAGNOSIS — R1011 Right upper quadrant pain: Secondary | ICD-10-CM | POA: Insufficient documentation

## 2022-10-28 DIAGNOSIS — E785 Hyperlipidemia, unspecified: Secondary | ICD-10-CM | POA: Insufficient documentation

## 2022-10-28 NOTE — Assessment & Plan Note (Signed)
Checking B12 level and adjust as needed. Taking oral.

## 2022-10-28 NOTE — Assessment & Plan Note (Signed)
Some irritable bowel symptoms with RUQ pain. Last CT reviewed from 2021 with some fatty liver and gallbladder unremarkable. Ordered RUQ [place] and CMP and CBC to assess.

## 2022-10-28 NOTE — Assessment & Plan Note (Signed)
Is still taking protonix 40 mg BID and still having some epigastric symptoms from time to time.

## 2022-10-28 NOTE — Assessment & Plan Note (Signed)
Checking lipid panel and calcium score. Adjust as needed.

## 2022-11-17 ENCOUNTER — Encounter (HOSPITAL_COMMUNITY): Payer: Self-pay

## 2022-11-17 ENCOUNTER — Ambulatory Visit (HOSPITAL_COMMUNITY)
Admission: RE | Admit: 2022-11-17 | Discharge: 2022-11-17 | Disposition: A | Discharge status: Home / Self Care | Payer: Self-pay | Source: Ambulatory Visit | Attending: Internal Medicine | Admitting: Internal Medicine

## 2022-11-17 DIAGNOSIS — E785 Hyperlipidemia, unspecified: Secondary | ICD-10-CM | POA: Insufficient documentation

## 2023-01-26 ENCOUNTER — Other Ambulatory Visit (HOSPITAL_COMMUNITY): Payer: Self-pay

## 2023-03-19 ENCOUNTER — Other Ambulatory Visit (HOSPITAL_BASED_OUTPATIENT_CLINIC_OR_DEPARTMENT_OTHER): Payer: Self-pay

## 2023-03-19 ENCOUNTER — Other Ambulatory Visit (HOSPITAL_COMMUNITY): Payer: Self-pay

## 2023-03-19 ENCOUNTER — Telehealth: Payer: 59 | Admitting: Family Medicine

## 2023-03-19 DIAGNOSIS — B9689 Other specified bacterial agents as the cause of diseases classified elsewhere: Secondary | ICD-10-CM | POA: Diagnosis not present

## 2023-03-19 DIAGNOSIS — J019 Acute sinusitis, unspecified: Secondary | ICD-10-CM | POA: Diagnosis not present

## 2023-03-19 MED ORDER — AMOXICILLIN-POT CLAVULANATE 875-125 MG PO TABS
1.0000 | ORAL_TABLET | Freq: Two times a day (BID) | ORAL | 0 refills | Status: AC
Start: 2023-03-19 — End: 2023-03-26
  Filled 2023-03-19: qty 14, 7d supply, fill #0

## 2023-03-19 NOTE — Progress Notes (Signed)

## 2023-06-03 ENCOUNTER — Other Ambulatory Visit (HOSPITAL_COMMUNITY): Payer: Self-pay

## 2023-06-03 ENCOUNTER — Telehealth: Payer: Self-pay | Admitting: Gastroenterology

## 2023-06-03 DIAGNOSIS — K2100 Gastro-esophageal reflux disease with esophagitis, without bleeding: Secondary | ICD-10-CM

## 2023-06-03 MED ORDER — VOQUEZNA 10 MG PO TABS
10.0000 mg | ORAL_TABLET | Freq: Every day | ORAL | 3 refills | Status: DC
Start: 2023-06-03 — End: 2023-06-10
  Filled 2023-06-03: qty 90, 90d supply, fill #0

## 2023-06-03 NOTE — Telephone Encounter (Signed)
 Symptoms of heartburn and GERD are better controlled on 10 mg daily of Voquenza, tried samples with improvement.  Discontinue pantoprazole.  Will send prescription for Voquenza  10 mg daily

## 2023-06-03 NOTE — Telephone Encounter (Signed)
 Marland Kitchen

## 2023-06-05 ENCOUNTER — Telehealth: Payer: Self-pay

## 2023-06-05 ENCOUNTER — Other Ambulatory Visit (HOSPITAL_COMMUNITY): Payer: Self-pay

## 2023-06-05 NOTE — Telephone Encounter (Signed)
 Pharmacy Patient Advocate Encounter   Received notification from CoverMyMeds that prior authorization for Voquezna 10MG  tablets is required/requested.   Insurance verification completed.   The patient is insured through [facility] .   Per test claim: PA required; PA submitted to above mentioned insurance via CoverMyMeds Key/confirmation #/EOC [facility] Status is pending

## 2023-06-08 NOTE — Telephone Encounter (Signed)
 Pharmacy Patient Advocate Encounter  Received notification from [facility] that Prior Authorization for Voquezna 10MG  tablets has been DENIED.  Full denial letter will be uploaded to the media tab. See denial reason below.  You did not meet the specific rule(s) needed to approve this request. In some cases, the requested drug or alternatives offered may have other guideline rules that need to be met.   Following rule(s) to be met for approval:  1) Your diagnosis has been confirmed via endoscopy (a procedure to look inside your body) and there is NO evidence of erosion (damage to the lining of the esophagus caused by stomach acids and juices backing up into the esophagus).  2) You have previously tried TWO proton pump inhibitors (PPIs, a class of medication, such as rabeprazole, omeprazole, or pantoprazole) at the maximum dosage for a minimum of eight weeks.   PA #/Case ID/Reference #: [id number]

## 2023-06-10 ENCOUNTER — Other Ambulatory Visit (HOSPITAL_COMMUNITY): Payer: Self-pay

## 2023-06-10 MED ORDER — VOQUEZNA 10 MG PO TABS: 10.0000 mg | ORAL_TABLET | Freq: Every day | ORAL | 3 refills | Status: AC

## 2023-06-10 NOTE — Telephone Encounter (Signed)
 Resent the Voquezna to [facility] at patients request

## 2023-06-17 ENCOUNTER — Other Ambulatory Visit (HOSPITAL_COMMUNITY): Payer: Self-pay

## 2023-06-19 ENCOUNTER — Other Ambulatory Visit (HOSPITAL_COMMUNITY): Payer: Self-pay

## 2023-07-09 ENCOUNTER — Other Ambulatory Visit: Payer: Self-pay | Admitting: *Deleted

## 2023-07-09 ENCOUNTER — Telehealth: Payer: Self-pay | Admitting: Gastroenterology

## 2023-07-09 DIAGNOSIS — M546 Pain in thoracic spine: Secondary | ICD-10-CM

## 2023-07-09 DIAGNOSIS — G8929 Other chronic pain: Secondary | ICD-10-CM

## 2023-07-09 DIAGNOSIS — R1013 Epigastric pain: Secondary | ICD-10-CM

## 2023-07-09 DIAGNOSIS — R110 Nausea: Secondary | ICD-10-CM

## 2023-07-09 NOTE — Telephone Encounter (Signed)
 Scheduled CT scan for 07/10/23 @ 6 pm. Patient informed.

## 2023-07-09 NOTE — Telephone Encounter (Signed)
 Complaints of worsening epigastric and periumbilical abdominal pain, progressively worse in the past 3 to 4 months.  She has had intermittent pain in the past but in the past few weeks she is experiencing severe pain after every meal radiating to the back associated with nausea.  No vomiting.  She is also having change in bowel habits with intermittent severe abdominal cramping and diarrhea. She feels better if she only does clear liquids but anytime she eats anything the pain comes back, in the last 2 days she has been doing mostly clear liquids.  Family history positive for colon cancer in her maternal grandfather in his 31s.  On abdominal exam palpable forearm mass in mid abdomen periumbilical area, tender on deep palpation with some guarding  Will obtain stat CT abdomen pelvis with contrast for further evaluation, exclude ovarian neoplasm or possible uterine fibroids, neoplastic lesion  History of longstanding GERD, with persistent symptoms despite treatment, plan to proceed with EGD and colonoscopy to exclude gastroduodenal ulcer, IBD  She did labs through [facility] health for doctor's day, awaiting results, she has not received them yet, will inform us  when she has those if not we will repeat them Trial of lactose-free diet She had testing for celiac panel in the past, negative for TTG IgA antibody.  Lorena Rolling , MD [phone]

## 2023-07-10 ENCOUNTER — Ambulatory Visit (HOSPITAL_COMMUNITY)
Admission: RE | Admit: 2023-07-10 | Discharge: 2023-07-10 | Disposition: A | Discharge status: Home / Self Care | Source: Ambulatory Visit | Attending: Gastroenterology | Admitting: Gastroenterology

## 2023-07-10 DIAGNOSIS — G8929 Other chronic pain: Secondary | ICD-10-CM | POA: Diagnosis not present

## 2023-07-10 DIAGNOSIS — R1013 Epigastric pain: Secondary | ICD-10-CM | POA: Diagnosis not present

## 2023-07-10 DIAGNOSIS — R1033 Periumbilical pain: Secondary | ICD-10-CM | POA: Diagnosis not present

## 2023-07-10 DIAGNOSIS — R110 Nausea: Secondary | ICD-10-CM | POA: Diagnosis not present

## 2023-07-10 DIAGNOSIS — K760 Fatty (change of) liver, not elsewhere classified: Secondary | ICD-10-CM | POA: Diagnosis not present

## 2023-07-10 MED ORDER — IOHEXOL 300 MG/ML  SOLN
100.0000 mL | Freq: Once | INTRAMUSCULAR | Status: AC | PRN
  Administered 2023-07-10: 100 mL via INTRAVENOUS

## 2023-07-10 MED ORDER — IOHEXOL 9 MG/ML PO SOLN
500.0000 mL | ORAL | Status: AC
  Administered 2023-07-10: 500 mL via ORAL

## 2023-08-17 ENCOUNTER — Other Ambulatory Visit: Payer: Self-pay | Admitting: Gastroenterology

## 2023-08-17 ENCOUNTER — Encounter: Payer: Self-pay | Admitting: *Deleted

## 2023-08-17 ENCOUNTER — Other Ambulatory Visit (HOSPITAL_COMMUNITY): Payer: Self-pay

## 2023-08-17 ENCOUNTER — Encounter: Admitting: Internal Medicine

## 2023-08-17 MED ORDER — PANTOPRAZOLE SODIUM 40 MG PO TBEC
40.0000 mg | DELAYED_RELEASE_TABLET | Freq: Two times a day (BID) | ORAL | 0 refills | Status: DC
  Filled 2023-08-17: qty 180, 90d supply, fill #0

## 2023-08-24 ENCOUNTER — Ambulatory Visit (AMBULATORY_SURGERY_CENTER): Admitting: Gastroenterology

## 2023-08-24 ENCOUNTER — Encounter: Payer: Self-pay | Admitting: Gastroenterology

## 2023-08-24 VITALS — BP 112/76 | HR 61 | Temp 98.4°F | Resp 19 | Ht 64.0 in | Wt 162.6 lb

## 2023-08-24 DIAGNOSIS — R1033 Periumbilical pain: Secondary | ICD-10-CM | POA: Diagnosis not present

## 2023-08-24 DIAGNOSIS — K219 Gastro-esophageal reflux disease without esophagitis: Secondary | ICD-10-CM | POA: Diagnosis not present

## 2023-08-24 DIAGNOSIS — K648 Other hemorrhoids: Secondary | ICD-10-CM | POA: Diagnosis not present

## 2023-08-24 DIAGNOSIS — F419 Anxiety disorder, unspecified: Secondary | ICD-10-CM | POA: Diagnosis not present

## 2023-08-24 DIAGNOSIS — K644 Residual hemorrhoidal skin tags: Secondary | ICD-10-CM

## 2023-08-24 DIAGNOSIS — R110 Nausea: Secondary | ICD-10-CM

## 2023-08-24 DIAGNOSIS — Z800 Family history of malignant neoplasm of digestive organs: Secondary | ICD-10-CM | POA: Diagnosis not present

## 2023-08-24 DIAGNOSIS — K449 Diaphragmatic hernia without obstruction or gangrene: Secondary | ICD-10-CM

## 2023-08-24 DIAGNOSIS — G8929 Other chronic pain: Secondary | ICD-10-CM

## 2023-08-24 DIAGNOSIS — R194 Change in bowel habit: Secondary | ICD-10-CM | POA: Diagnosis not present

## 2023-08-24 MED ORDER — SODIUM CHLORIDE 0.9 % IV SOLN: 500.0000 mL | Freq: Once | INTRAVENOUS | Status: DC

## 2023-08-24 NOTE — Op Note (Signed)
 [facility] Patient Name: Lynn West Procedure Date: 08/24/2023 10:30 AM MRN: [id number] Endoscopist: Sergio Dandy , MD, [id number] Age: 38 Referring MD:  Date of Birth: 10-Dec-1985 Gender: Female Account #: [id number] Procedure:                Upper GI endoscopy Indications:              Gastro-esophageal reflux disease, Nausea,                            Persistent of unknown cause, abdominal pain Medicines:                Monitored Anesthesia Care Procedure:                Pre-Anesthesia Assessment:                           - Prior to the procedure, a History and Physical                            was performed, and patient medications and                            allergies were reviewed. The patient's tolerance of                            previous anesthesia was also reviewed. The risks                            and benefits of the procedure and the sedation                            options and risks were discussed with the patient.                            All questions were answered, and informed consent                            was obtained. Prior Anticoagulants: The patient has                            taken no anticoagulant or antiplatelet agents. ASA                            Grade Assessment: II - A patient with mild systemic                            disease. After reviewing the risks and benefits,                            the patient was deemed in satisfactory condition to                            undergo the procedure.  After obtaining informed consent, the endoscope was                            passed under direct vision. Throughout the                            procedure, the patient's blood pressure, pulse, and                            oxygen saturations were monitored continuously. The                            Olympus Scope [id number] was introduced through the                            mouth,  and advanced to the second part of duodenum.                            The upper GI endoscopy was accomplished without                            difficulty. The patient tolerated the procedure                            well. Scope In: Scope Out: Findings:                 The gastroesophageal flap valve was visualized                            endoscopically and classified as Hill Grade III                            (minimal fold, loose to endoscope, hiatal hernia                            likely).                           A 4 cm hiatal hernia was present.                           The exam of the esophagus was otherwise normal.                           The stomach was normal.                           The cardia and gastric fundus were normal on                            retroflexion.                           The examined duodenum was normal. Complications:            No immediate  complications. Estimated Blood Loss:     Estimated blood loss was minimal. Impression:               - Gastroesophageal flap valve classified as Hill                            Grade III (minimal fold, loose to endoscope, hiatal                            hernia likely).                           - 4 cm hiatal hernia.                           - Normal stomach.                           - Normal examined duodenum.                           - No specimens collected. Recommendation:           - Patient has a contact number available for                            emergencies. The signs and symptoms of potential                            delayed complications were discussed with the                            patient. Return to normal activities tomorrow.                            Written discharge instructions were provided to the                            patient.                           - Resume previous diet.                           - Continue present medications.                            - Follow an antireflux regimen.                           - See the other procedure note for documentation of                            additional recommendations. [name] V. [name], MD 08/24/2023 11:07:02 AM This report has been signed electronically.

## 2023-08-24 NOTE — Op Note (Signed)
 [facility] Patient Name: Lynn West Procedure Date: 08/24/2023 10:29 AM MRN: [id number] Endoscopist: Sergio Dandy , MD, [id number] Age: 38 Referring MD:  Date of Birth: 1986/02/19 Gender: Female Account #: [id number] Procedure:                Colonoscopy Indications:              Periumbilical abdominal pain, Change in bowel                            habits, family history of colon cancer in                            grandfather at age 34 Medicines:                Monitored Anesthesia Care Procedure:                Pre-Anesthesia Assessment:                           - Prior to the procedure, a History and Physical                            was performed, and patient medications and                            allergies were reviewed. The patient's tolerance of                            previous anesthesia was also reviewed. The risks                            and benefits of the procedure and the sedation                            options and risks were discussed with the patient.                            All questions were answered, and informed consent                            was obtained. Prior Anticoagulants: The patient has                            taken no anticoagulant or antiplatelet agents. ASA                            Grade Assessment: II - A patient with mild systemic                            disease. After reviewing the risks and benefits,                            the patient was deemed in satisfactory condition to  undergo the procedure.                           After obtaining informed consent, the colonoscope                            was passed under direct vision. Throughout the                            procedure, the patient's blood pressure, pulse, and                            oxygen saturations were monitored continuously. The                            Olympus Scope SN [id number] was introduced  through the                            anus and advanced to the the terminal ileum, with                            identification of the appendiceal orifice and IC                            valve. The colonoscopy was performed without                            difficulty. The patient tolerated the procedure                            well. The quality of the bowel preparation was                            good. The terminal ileum, ileocecal valve,                            appendiceal orifice, and rectum were photographed. Scope In: 10:45:08 AM Scope Out: 10:58:06 AM Scope Withdrawal Time: 0 hours 8 minutes 40 seconds  Total Procedure Duration: 0 hours 12 minutes 58 seconds  Findings:                 The perianal and digital rectal examinations were                            normal.                           The terminal ileum appeared normal.                           Non-bleeding external and internal hemorrhoids were                            found during retroflexion. The hemorrhoids were  small.                           The exam was otherwise without abnormality. Complications:            No immediate complications. Estimated Blood Loss:     Estimated blood loss was minimal. Impression:               - The examined portion of the ileum was normal.                           - Non-bleeding external and internal hemorrhoids.                           - The examination was otherwise normal.                           - No specimens collected. Recommendation:           - Resume previous diet.                           - Continue present medications.                           - Repeat colonoscopy in 10 years for surveillance. [name] V. Mathews Stuhr, MD 08/24/2023 11:03:10 AM This report has been signed electronically.

## 2023-08-24 NOTE — Patient Instructions (Signed)

## 2023-08-24 NOTE — Progress Notes (Unsigned)
 [facility] History and Physical   Primary Care Physician:  Adelia Homestead, MD   Reason for Procedure:  Nausea, peri umbilical abdominal pain, change in bowel habits  Plan:    EGD and colonoscopy with possible interventions as needed     HPI: Lynn West is a very pleasant 38 y.o. female here for EGD and colonoscopy for evaluation of persistent nausea, unexplained peri umbilical abdominal pain and change in bowel habits.   The risks and benefits as well as alternatives of endoscopic procedure(s) have been discussed and reviewed. All questions answered. The patient agrees to proceed.    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 Authorizing Provider  drospirenone -ethinyl estradiol  (YAZ) 3-0.02 MG tablet Take 1 tablet by mouth once daily.  Need to make appointment 08/20/20  Yes   pantoprazole  (PROTONIX ) 40 MG tablet Take 1 tablet (40 mg total) by mouth 2 (two) times daily before a meal. 08/17/23  Yes Yuleni Burich V, MD  vitamin B-12 (CYANOCOBALAMIN )  250 MCG tablet    Yes [provider]  VITAMIN D  PO Take by mouth.   Yes [provider]  drospirenone -ethinyl estradiol  (YAZ) 3-0.02 MG tablet Take 1 tablet by mouth daily continously, take active tablets only 09/15/22     ketoconazole  (NIZORAL ) 2 % cream Apply twice daily to areas on body x 3 weeks as needed 03/18/21     Vonoprazan Fumarate  (VOQUEZNA ) 10 MG TABS Take 10 mg by mouth daily before breakfast. Patient not taking: Reported on 08/24/2023 06/10/23   Aubriella Perezgarcia V, MD    Current Outpatient Medications  Medication Sig Dispense Refill   drospirenone -ethinyl estradiol  (YAZ) 3-0.02 MG tablet Take 1 tablet by mouth once daily.  Need to make appointment 84 tablet 4   pantoprazole  (PROTONIX ) 40 MG tablet Take 1 tablet (40 mg total) by mouth 2 (two) times daily before a meal. 180 tablet 0   vitamin B-12 (CYANOCOBALAMIN ) 250 MCG tablet      VITAMIN D  PO Take by mouth.     drospirenone -ethinyl estradiol  (YAZ) 3-0.02 MG tablet Take 1 tablet by mouth daily continously, take active tablets only 112 tablet 4   ketoconazole  (NIZORAL ) 2 % cream Apply twice daily to areas on body x 3 weeks as needed 60 g 3   Vonoprazan Fumarate  (VOQUEZNA ) 10 MG TABS Take 10 mg by mouth daily before breakfast. (Patient not taking: Reported on 08/24/2023) 90 tablet 3   Current Facility-Administered Medications  Medication Dose Route Frequency Provider Last  Rate Last Admin   0.9 %  sodium chloride  infusion  500 mL Intravenous Once Merrill Deanda V, MD        Allergies as of 08/24/2023   (No Known Allergies)    Family History  Problem Relation Age of Onset   Arthritis Mother    Hypertension Mother    Hypothyroidism Mother    Arthritis Father    Hyperlipidemia Father    Hypertension Father    Coronary artery disease Maternal Uncle 20   Heart disease Maternal Uncle    Hyperlipidemia Maternal Grandmother    Hypertension Maternal Grandmother    Heart disease Maternal Grandmother    Colon  cancer Maternal Grandfather 36   Diabetes Maternal Grandfather    Breast cancer Paternal Grandmother 35   Diabetes Paternal Grandmother    Esophageal cancer Neg Hx    Stomach cancer Neg Hx    Rectal cancer Neg Hx     Social History   Socioeconomic History   Marital status: Married    Spouse name: Not on file   Number of children: 0   Years of education: Not on file   Highest education level: Not on file  Occupational History    Employer: [employer]  Tobacco Use   Smoking status: Never   Smokeless tobacco: Never   Tobacco comments:    PA with [employer], moved to [place] 10/2011 from [place]   with spouse  Vaping Use   Vaping status: Never Used  Substance and Sexual Activity   Alcohol use: Yes    Comment: social   Drug use: No    Comment: [id number]   Sexual activity: Not on file  Other Topics Concern   Not on file  Social History Narrative   Not on file   Social Drivers of Health   Financial Resource Strain: Low Risk  (04/09/2018)   Overall Financial Resource Strain (CARDIA)    Difficulty of Paying Living Expenses: Not hard at all  Food Insecurity: No Food Insecurity (04/09/2018)   Hunger Vital Sign    Worried About Running Out of Food in the Last Year: Never true    Ran Out of Food in the Last Year: Never true  Transportation Needs: Unknown (04/09/2018)   PRAPARE - Administrator, Civil Service (Medical): No    Lack of Transportation (Non-Medical): Not on file  Physical Activity: Not on file  Stress: No Stress Concern Present (04/09/2018)   [employer] of Occupational Health - Occupational Stress Questionnaire    Feeling of Stress : Only a little  Social Connections: Not on file  Intimate Partner Violence: Not At Risk (04/09/2018)   Humiliation, Afraid, Rape, and Kick questionnaire    Fear of Current or Ex-Partner: No    Emotionally Abused: No    Physically Abused: No    Sexually Abused: No    Review of Systems:  All other review of systems  negative except as mentioned in the HPI.  Physical Exam: Vital signs in last 24 hours: BP (!) 155/95   Pulse 82   Temp 98.4 F (36.9 C)   Ht 5' 4 (1.626 m)   Wt 162 lb 9.6 oz (73.8 kg)   BMI 27.91 kg/m  General:   Alert, NAD Lungs:  Clear .   Heart:  Regular rate and rhythm Abdomen:  Soft, nontender and nondistended. Neuro/Psych:  Alert and cooperative. Normal mood and affect. A and O x 3  Reviewed labs, radiology imaging, old records  and pertinent past GI work up  Patient is appropriate for planned procedure(s) and anesthesia in an ambulatory setting   K. Veena Earnestene Angello , MD [phone]

## 2023-08-24 NOTE — Progress Notes (Unsigned)
 Vss nad trans to pacu

## 2023-08-25 ENCOUNTER — Telehealth: Payer: Self-pay | Admitting: *Deleted

## 2023-08-25 NOTE — Telephone Encounter (Signed)
  Follow up Call-     08/24/2023    9:40 AM  Call back number  Post procedure Call Back phone  # [phone]  Permission to leave phone message Yes     Patient questions:  Do you have a fever, pain , or abdominal swelling? No. Pain Score  0 *  Have you tolerated food without any problems? Yes.    Have you been able to return to your normal activities? Yes.    Do you have any questions about your discharge instructions: Diet   No. Medications  No. Follow up visit  No.  Do you have questions or concerns about your Care? No.  Actions: * If pain score is 4 or above: No action needed, pain <4.

## 2023-09-28 DIAGNOSIS — H52203 Unspecified astigmatism, bilateral: Secondary | ICD-10-CM | POA: Diagnosis not present

## 2023-09-28 DIAGNOSIS — H5213 Myopia, bilateral: Secondary | ICD-10-CM | POA: Diagnosis not present

## 2023-10-07 ENCOUNTER — Other Ambulatory Visit (INDEPENDENT_AMBULATORY_CARE_PROVIDER_SITE_OTHER)

## 2023-10-07 ENCOUNTER — Ambulatory Visit: Payer: Self-pay | Admitting: Internal Medicine

## 2023-10-07 DIAGNOSIS — E785 Hyperlipidemia, unspecified: Secondary | ICD-10-CM | POA: Diagnosis not present

## 2023-10-07 DIAGNOSIS — R1011 Right upper quadrant pain: Secondary | ICD-10-CM | POA: Diagnosis not present

## 2023-10-07 DIAGNOSIS — D519 Vitamin B12 deficiency anemia, unspecified: Secondary | ICD-10-CM | POA: Diagnosis not present

## 2023-10-07 LAB — CBC
HCT: 36.1 % (ref 36.0–46.0)
Hemoglobin: 12.3 g/dL (ref 12.0–15.0)
MCHC: 34 g/dL (ref 30.0–36.0)
MCV: 86.7 fl (ref 78.0–100.0)
Platelets: 188 K/uL (ref 150.0–400.0)
RBC: 4.16 Mil/uL (ref 3.87–5.11)
RDW: 13.4 % (ref 11.5–15.5)
WBC: 4.9 K/uL (ref 4.0–10.5)

## 2023-10-07 LAB — LIPID PANEL
Cholesterol: 193 mg/dL (ref 0–200)
HDL: 53.5 mg/dL (ref >39.00)
LDL Cholesterol: 117 mg/dL — ABNORMAL HIGH (ref 0–99)
NonHDL: 139.2
Total CHOL/HDL Ratio: 4
Triglycerides: 113 mg/dL (ref 0.0–149.0)
VLDL: 22.6 mg/dL (ref 0.0–40.0)

## 2023-10-07 LAB — COMPREHENSIVE METABOLIC PANEL WITH GFR
ALT: 14 U/L (ref 0–35)
AST: 15 U/L (ref 0–37)
Albumin: 4.2 g/dL (ref 3.5–5.2)
Alkaline Phosphatase: 32 U/L — ABNORMAL LOW (ref 39–117)
BUN: 17 mg/dL (ref 6–23)
CO2: 23 meq/L (ref 19–32)
Calcium: 8.9 mg/dL (ref 8.4–10.5)
Chloride: 107 meq/L (ref 96–112)
Creatinine, Ser: 0.77 mg/dL (ref 0.40–1.20)
GFR: 96.99 mL/min (ref >60.00)
Glucose, Bld: 86 mg/dL (ref 70–99)
Potassium: 3.8 meq/L (ref 3.5–5.1)
Sodium: 139 meq/L (ref 135–145)
Total Bilirubin: 0.6 mg/dL (ref 0.2–1.2)
Total Protein: 6.6 g/dL (ref 6.0–8.3)

## 2023-10-07 LAB — HEMOGLOBIN A1C: Hgb A1c MFr Bld: 5.2 % (ref 4.6–6.5)

## 2023-10-07 LAB — VITAMIN D 25 HYDROXY (VIT D DEFICIENCY, FRACTURES): VITD: 76.41 ng/mL (ref 30.00–100.00)

## 2023-10-07 LAB — VITAMIN B12: Vitamin B-12: 719 pg/mL (ref 211–911)

## 2023-10-21 ENCOUNTER — Telehealth: Payer: Self-pay | Admitting: Physician Assistant

## 2023-10-21 DIAGNOSIS — K2100 Gastro-esophageal reflux disease with esophagitis, without bleeding: Secondary | ICD-10-CM

## 2023-10-21 NOTE — Telephone Encounter (Signed)
 Discussed with patient about her long standing GERD.  She is controlled well enough on pantoprazole  once daily, had recent EGD that showed 4 cm HH, hill grade 3, no gastritis. No specimen's collected.  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

## 2023-10-26 ENCOUNTER — Encounter (HOSPITAL_COMMUNITY): Payer: Self-pay

## 2023-10-26 ENCOUNTER — Other Ambulatory Visit (HOSPITAL_COMMUNITY): Payer: Self-pay

## 2023-10-27 ENCOUNTER — Other Ambulatory Visit (HOSPITAL_COMMUNITY): Payer: Self-pay

## 2023-10-27 ENCOUNTER — Other Ambulatory Visit (HOSPITAL_BASED_OUTPATIENT_CLINIC_OR_DEPARTMENT_OTHER): Payer: Self-pay

## 2023-10-27 MED ORDER — DROSPIRENONE-ETHINYL ESTRADIOL 3-0.02 MG PO TABS
1.0000 | ORAL_TABLET | Freq: Every day | ORAL | 0 refills | Status: AC
Start: 2023-10-27 — End: ?
  Filled 2023-10-27: qty 112, 96d supply, fill #0
  Filled 2023-10-27: qty 84, 72d supply, fill #0

## 2023-11-06 ENCOUNTER — Encounter: Payer: Self-pay | Admitting: Physician Assistant

## 2023-11-12 ENCOUNTER — Other Ambulatory Visit: Payer: Self-pay | Admitting: Gastroenterology

## 2023-11-12 ENCOUNTER — Other Ambulatory Visit (HOSPITAL_COMMUNITY): Payer: Self-pay

## 2023-11-12 MED ORDER — PANTOPRAZOLE SODIUM 40 MG PO TBEC
40.0000 mg | DELAYED_RELEASE_TABLET | Freq: Two times a day (BID) | ORAL | 0 refills | Status: AC
  Filled 2023-11-12: qty 180, 90d supply, fill #0

## 2023-11-27 DIAGNOSIS — L814 Other melanin hyperpigmentation: Secondary | ICD-10-CM | POA: Diagnosis not present

## 2023-11-27 DIAGNOSIS — L821 Other seborrheic keratosis: Secondary | ICD-10-CM | POA: Diagnosis not present

## 2024-01-25 ENCOUNTER — Other Ambulatory Visit (HOSPITAL_COMMUNITY): Payer: Self-pay

## 2024-01-25 MED ORDER — DROSPIRENONE-ETHINYL ESTRADIOL 3-0.02 MG PO TABS
1.0000 | ORAL_TABLET | Freq: Every day | ORAL | 0 refills | Status: AC
Start: 2024-01-25 — End: ?
  Filled 2024-01-25: qty 84, 72d supply, fill #0

## 2024-02-01 ENCOUNTER — Other Ambulatory Visit (HOSPITAL_COMMUNITY): Payer: Self-pay

## 2024-02-01 DIAGNOSIS — Z113 Encounter for screening for infections with a predominantly sexual mode of transmission: Secondary | ICD-10-CM | POA: Diagnosis not present

## 2024-02-01 DIAGNOSIS — Z124 Encounter for screening for malignant neoplasm of cervix: Secondary | ICD-10-CM | POA: Diagnosis not present

## 2024-02-01 DIAGNOSIS — Z1331 Encounter for screening for depression: Secondary | ICD-10-CM | POA: Diagnosis not present

## 2024-02-01 DIAGNOSIS — Z01411 Encounter for gynecological examination (general) (routine) with abnormal findings: Secondary | ICD-10-CM | POA: Diagnosis not present

## 2024-02-01 DIAGNOSIS — Z01419 Encounter for gynecological examination (general) (routine) without abnormal findings: Secondary | ICD-10-CM | POA: Diagnosis not present

## 2024-02-01 DIAGNOSIS — Z1231 Encounter for screening mammogram for malignant neoplasm of breast: Secondary | ICD-10-CM | POA: Diagnosis not present

## 2024-02-01 MED ORDER — DROSPIRENONE-ETHINYL ESTRADIOL 3-0.02 MG PO TABS
ORAL_TABLET | ORAL | 4 refills | Status: AC
  Filled 2024-02-01 – 2024-03-31 (×2): qty 112, 84d supply, fill #0

## 2024-03-31 ENCOUNTER — Other Ambulatory Visit (HOSPITAL_COMMUNITY): Payer: Self-pay
